# Patient Record
Sex: Female | Born: 1965
Health system: Southern US, Community
[De-identification: ages and names within clinical notes are randomized; demographics above are authoritative.]

## PROBLEM LIST (undated history)

## (undated) DIAGNOSIS — T7840XA Allergy, unspecified, initial encounter: Secondary | ICD-10-CM

## (undated) DIAGNOSIS — F329 Major depressive disorder, single episode, unspecified: Secondary | ICD-10-CM

## (undated) DIAGNOSIS — M7580 Other shoulder lesions, unspecified shoulder: Secondary | ICD-10-CM

## (undated) DIAGNOSIS — J45909 Unspecified asthma, uncomplicated: Secondary | ICD-10-CM

## (undated) DIAGNOSIS — M8580 Other specified disorders of bone density and structure, unspecified site: Secondary | ICD-10-CM

## (undated) DIAGNOSIS — M1990 Unspecified osteoarthritis, unspecified site: Secondary | ICD-10-CM

## (undated) DIAGNOSIS — M722 Plantar fascial fibromatosis: Secondary | ICD-10-CM

## (undated) DIAGNOSIS — B029 Zoster without complications: Secondary | ICD-10-CM

## (undated) DIAGNOSIS — F32A Depression, unspecified: Secondary | ICD-10-CM

## (undated) DIAGNOSIS — R87619 Unspecified abnormal cytological findings in specimens from cervix uteri: Secondary | ICD-10-CM

## (undated) DIAGNOSIS — A64 Unspecified sexually transmitted disease: Secondary | ICD-10-CM

## (undated) HISTORY — PX: COLONOSCOPY: SHX174

## (undated) HISTORY — DX: Allergy, unspecified, initial encounter: T78.40XA

## (undated) HISTORY — PX: COLPOSCOPY: SHX161

## (undated) HISTORY — DX: Zoster without complications: B02.9

## (undated) HISTORY — DX: Major depressive disorder, single episode, unspecified: F32.9

## (undated) HISTORY — DX: Unspecified asthma, uncomplicated: J45.909

## (undated) HISTORY — DX: Unspecified abnormal cytological findings in specimens from cervix uteri: R87.619

## (undated) HISTORY — DX: Unspecified osteoarthritis, unspecified site: M19.90

## (undated) HISTORY — PX: INTRAUTERINE DEVICE INSERTION: SHX323

## (undated) HISTORY — DX: Depression, unspecified: F32.A

## (undated) HISTORY — DX: Plantar fascial fibromatosis: M72.2

## (undated) HISTORY — DX: Other specified disorders of bone density and structure, unspecified site: M85.80

## (undated) HISTORY — DX: Unspecified sexually transmitted disease: A64

## (undated) HISTORY — DX: Other shoulder lesions, unspecified shoulder: M75.80

## (undated) HISTORY — PX: WISDOM TOOTH EXTRACTION: SHX21

---
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
## 1997-09-19 ENCOUNTER — Other Ambulatory Visit: Admission: RE | Admit: 1997-09-19 | Discharge: 1997-09-19 | Payer: Self-pay | Admitting: *Deleted

## 2000-01-01 ENCOUNTER — Other Ambulatory Visit: Admission: RE | Admit: 2000-01-01 | Discharge: 2000-01-01 | Payer: Self-pay | Admitting: Internal Medicine

## 2001-05-08 ENCOUNTER — Other Ambulatory Visit: Admission: RE | Admit: 2001-05-08 | Discharge: 2001-05-08 | Payer: Self-pay | Admitting: Internal Medicine

## 2001-05-12 ENCOUNTER — Encounter: Admission: RE | Admit: 2001-05-12 | Discharge: 2001-05-12 | Payer: Self-pay | Admitting: *Deleted

## 2002-10-31 ENCOUNTER — Other Ambulatory Visit: Admission: RE | Admit: 2002-10-31 | Discharge: 2002-10-31 | Payer: Self-pay | Admitting: Obstetrics and Gynecology

## 2004-01-15 ENCOUNTER — Other Ambulatory Visit: Admission: RE | Admit: 2004-01-15 | Discharge: 2004-01-15 | Payer: Self-pay | Admitting: Obstetrics and Gynecology

## 2005-04-08 ENCOUNTER — Encounter: Admission: RE | Admit: 2005-04-08 | Discharge: 2005-04-08 | Payer: Self-pay | Admitting: Obstetrics and Gynecology

## 2005-04-19 ENCOUNTER — Other Ambulatory Visit: Admission: RE | Admit: 2005-04-19 | Discharge: 2005-04-19 | Payer: Self-pay | Admitting: Obstetrics and Gynecology

## 2005-04-26 ENCOUNTER — Encounter: Admission: RE | Admit: 2005-04-26 | Discharge: 2005-04-26 | Payer: Self-pay | Admitting: Obstetrics and Gynecology

## 2007-10-27 ENCOUNTER — Encounter: Payer: Self-pay | Admitting: Gastroenterology

## 2007-11-27 ENCOUNTER — Other Ambulatory Visit: Admission: RE | Admit: 2007-11-27 | Discharge: 2007-11-27 | Payer: Self-pay | Admitting: Obstetrics and Gynecology

## 2007-11-28 DIAGNOSIS — K921 Melena: Secondary | ICD-10-CM | POA: Insufficient documentation

## 2007-11-29 ENCOUNTER — Ambulatory Visit: Payer: Self-pay | Admitting: Gastroenterology

## 2007-11-29 DIAGNOSIS — R195 Other fecal abnormalities: Secondary | ICD-10-CM | POA: Insufficient documentation

## 2007-11-29 DIAGNOSIS — K625 Hemorrhage of anus and rectum: Secondary | ICD-10-CM | POA: Insufficient documentation

## 2007-12-27 ENCOUNTER — Encounter: Payer: Self-pay | Admitting: Gastroenterology

## 2007-12-27 ENCOUNTER — Ambulatory Visit: Payer: Self-pay | Admitting: Gastroenterology

## 2007-12-28 ENCOUNTER — Encounter: Payer: Self-pay | Admitting: Gastroenterology

## 2010-03-17 NOTE — Procedures (Signed)
Summary: Colonoscopy   Colonoscopy  Procedure date:  12/27/2007  Findings:      Location:  [facility].    Procedures Next Due Date:    Colonoscopy: 12/2012  Patient Name: Jaime Rogers Age MRN:  Procedure Procedures: Colonoscopy CPT: 16109.    with polypectomy. CPT: A3573898.  Personnel: Endoscopist: Venita Lick. Russella Dar, MD, Clementeen Graham.  Referred By: Serafina Royals Felipa Eth, MD.  Exam Location: Exam performed in Outpatient Clinic. Outpatient  Patient Consent: Procedure, Alternatives, Risks and Benefits discussed, consent obtained, from patient. Consent was obtained by the RN.  Indications  Evaluation of: Positive fecal occult blood test  Symptoms: Hematochezia.  Increased Risk Screening: For family history of colorectal neoplasia, in  grandparent age at onset: 11. Family History of Polyps.  Comments: PGF with colon cancer. Father with multiple colon polyps in his 41s History  Current Medications: Patient is not currently taking Coumadin.  Pre-Exam Physical: Performed Dec 27, 2007. Cardio-pulmonary exam, Rectal exam, HEENT exam , Abdominal exam, Mental status exam WNL.  Comments: Pt. history reviewed/updated, physical exam performed prior to initiation of sedation?Yes Exam Exam: Extent of exam reached: Cecum, extent intended: Cecum.  The cecum was identified by appendiceal orifice and IC valve. Time to Cecum: 00:01: 20. Time for Withdrawl: 00:10:24. Colon retroflexion performed. Images taken. ASA Classification: I. Tolerance: excellent.  Monitoring: Pulse and BP monitoring, Oximetry used. Supplemental O2 given.  Colon Prep Used MoviPrep for colon prep. Prep results: excellent.  Sedation Meds: Patient assessed and found to be appropriate for moderate (conscious) sedation. Fentanyl 100 mcg. given IV. Versed 10 mg. given IV.  Findings POLYP: Transverse Colon, Maximum size: 4 mm. sessile polyp. Procedure:  snare without cautery, removed, retrieved, Polyp sent to  pathology. ICD9: Colon Polyps: 211.3.  NORMAL EXAM: Splenic Flexure to Descending Colon.  NORMAL EXAM: Cecum to Hepatic Flexure.  DIVERTICULOSIS: Sigmoid Colon. Not bleeding. ICD9: Diverticulosis: 562.10. Comments: very mild.  NORMAL EXAM: Rectum.   Assessment  Diagnoses: 211.3: Colon Polyps.  562.10: Diverticulosis.   Events  Unplanned Interventions: No intervention was required.  Unplanned Events: There were no complications. Plans  Post Exam Instructions: Post sedation instructions given.  Medication Plan: Await pathology.  Patient Education: Patient given standard instructions for: Polyps. Diverticulosis.  Disposition: After procedure patient sent to recovery. After recovery patient sent home.  Scheduling/Referral: Colonoscopy, to [facility] T. Russella Dar, MD, [facility], around Dec 26, 2012.    cc: Thedora Hinders, MD  REPORT OF SURGICAL PATHOLOGY   Case #: [id number] Patient Name: Jaime Rogers. Office Chart Number:  [id number]   MRN: [id number] Pathologist: Ferd Hibbs. Colonel Bald, MD DOB/Age  [birth year] (Age: 89)    Gender: F Date Taken:  12/27/2007 Date Received: 12/27/2007   FINAL DIAGNOSIS   ***MICROSCOPIC EXAMINATION AND DIAGNOSIS***   COLON, TRANSVERSE, POLYP:   -  LIPOMA. -  MALIGNANT FEATURES ARE NOT IDENTIFIED.   jy Date Reported:  12/28/2007     Ferd Hibbs. Colonel Bald, MD *** Electronically Signed Out By [facility] ***  December 28, 2007 MRN: [id number]    Jaime Rogers Age [address] [place], [place]  [address]    Dear Ms. WOLTZ,  I am pleased to inform you that the biopsies taken during your recent colonoscopy did not show any evidence of cancer upon pathologic examination. The polypoid lesions was a lipoma, a harmless and benign fatty tissue growth. This lesion is not precancerous.  Continue with the treatment plan as outlined on the day of your exam.  You should have a repeat colonoscopy  examination for your family history in 5 years.  Please call us if you  are having persistent problems or have questions about your condition that have not been fully answered at this time.  Sincerely,  Meryl Dare MD [facility]  This letter has been electronically signed by your physician.  This report was created from the original endoscopy report, which was reviewed and signed by the above listed endoscopist.

## 2010-03-17 NOTE — Letter (Signed)
Summary: Patient Notice- Colon Biospy Results  [facility]  [address] [place], [place] [address]   Phone: [phone]  Fax: [phone]        December 28, 2007 MRN: [id number]    Jaime Rogers [address] [place], [place]  [address]    Dear Jaime Rogers,  I am pleased to inform you that the biopsies taken during your recent colonoscopy did not show any evidence of cancer upon pathologic examination. The polypoid lesions was a lipoma, a harmless and benign fatty tissue growth. This lesion is not precancerous.  Continue with the treatment plan as outlined on the day of your      exam.  You should have a repeat colonoscopy examination for your family history in 5 years.  Please call us if you are having persistent problems or have questions about your condition that have not been fully answered at this time.  Sincerely,  Meryl Dare MD [facility]  This letter has been electronically signed by your physician.

## 2010-09-02 ENCOUNTER — Other Ambulatory Visit: Payer: Self-pay | Admitting: Obstetrics and Gynecology

## 2010-09-02 DIAGNOSIS — Z1231 Encounter for screening mammogram for malignant neoplasm of breast: Secondary | ICD-10-CM

## 2010-09-10 ENCOUNTER — Ambulatory Visit
Admission: RE | Admit: 2010-09-10 | Discharge: 2010-09-10 | Disposition: A | Discharge status: Home / Self Care | Payer: BC Managed Care – PPO | Source: Ambulatory Visit | Attending: Obstetrics and Gynecology | Admitting: Obstetrics and Gynecology

## 2010-09-10 DIAGNOSIS — Z1231 Encounter for screening mammogram for malignant neoplasm of breast: Secondary | ICD-10-CM

## 2012-08-28 ENCOUNTER — Encounter: Payer: Self-pay | Admitting: Obstetrics and Gynecology

## 2012-08-28 ENCOUNTER — Ambulatory Visit (INDEPENDENT_AMBULATORY_CARE_PROVIDER_SITE_OTHER): Payer: BC Managed Care – PPO | Admitting: Obstetrics and Gynecology

## 2012-08-28 VITALS — BP 100/64 | HR 82 | Resp 18 | Ht 64.0 in | Wt 150.0 lb

## 2012-08-28 DIAGNOSIS — N390 Urinary tract infection, site not specified: Secondary | ICD-10-CM

## 2012-08-28 DIAGNOSIS — Z202 Contact with and (suspected) exposure to infections with a predominantly sexual mode of transmission: Secondary | ICD-10-CM

## 2012-08-28 DIAGNOSIS — N760 Acute vaginitis: Secondary | ICD-10-CM

## 2012-08-28 LAB — POCT URINALYSIS DIPSTICK
Bilirubin, UA: NEGATIVE
Blood, UA: NEGATIVE
Glucose, UA: NEGATIVE
Ketones, UA: NEGATIVE
Leukocytes, UA: NEGATIVE
Nitrite, UA: NEGATIVE
Protein, UA: NEGATIVE
Urobilinogen, UA: NEGATIVE
pH, UA: 6

## 2012-08-28 MED ORDER — METRONIDAZOLE 0.75 % VA GEL: 1.0000 | Freq: Two times a day (BID) | VAGINAL | Status: DC

## 2012-08-28 MED ORDER — FLUCONAZOLE 150 MG PO TABS: 150.0000 mg | ORAL_TABLET | Freq: Once | ORAL | Status: DC

## 2012-08-28 NOTE — Progress Notes (Signed)
47 yo separated WF G1P0 c/o vulvavag itching and burning and burning with urination because when she wipes, her vulva is tender.  She is currently finished a course of antibiotics for a URI.   Also wants STD testing, and has already collected a clean catch urine. She has ahd a new partner, they last time they were sexually active was 1 month ago.   Pt has a Mirena IUD, removal due Oct 2014.    Exam:  Ext sl erythematous.  Vag - white d/c.  Cx:  IUD strings viz, no purulence.  BM:  Uterus mobile and NT, adnexa neg.  WP:  Pos for yeast and BV.  Ph = 5.5  A:  Yeast vaginitis and BV  P:  Diflucan 150 mg po once now.  Metrogel pv bid for 5 days.  Sample taken from cx for GC, CHL.  Also check HIV, HepB, and RPR.  Condom use q time.  ;

## 2012-08-29 LAB — HEPATITIS B SURFACE ANTIGEN: Hepatitis B Surface Ag: NEGATIVE

## 2012-08-29 LAB — RPR

## 2012-08-29 LAB — HIV ANTIBODY (ROUTINE TESTING W REFLEX): HIV: NONREACTIVE

## 2012-08-30 LAB — IPS N GONORRHOEA AND CHLAMYDIA BY PCR

## 2012-10-24 ENCOUNTER — Encounter: Payer: Self-pay | Admitting: Gastroenterology

## 2012-10-31 ENCOUNTER — Telehealth: Payer: Self-pay | Admitting: Certified Nurse Midwife

## 2012-10-31 NOTE — Telephone Encounter (Signed)
Patient returning Sally's call. °

## 2012-10-31 NOTE — Telephone Encounter (Signed)
Patient wants to set up an appointment to have her IUD removed and replaced. Her old one expires in Oct.  She has an up coming appointment for her annual on Nov 28, 2012 at 8:30 am.

## 2012-10-31 NOTE — Telephone Encounter (Signed)
Last AEX 11-25-11 with Debbi, scheduled for 11-28-12. Mirena was placed 11-27-07 Call to patient, LMTCB.

## 2012-11-01 NOTE — Telephone Encounter (Signed)
Spoke with patient. Appointments booked for aex and iud recheck.

## 2012-11-28 ENCOUNTER — Encounter: Payer: Self-pay | Admitting: Certified Nurse Midwife

## 2012-11-28 ENCOUNTER — Other Ambulatory Visit: Payer: Self-pay | Admitting: Certified Nurse Midwife

## 2012-11-28 ENCOUNTER — Ambulatory Visit: Payer: Self-pay | Admitting: Certified Nurse Midwife

## 2012-11-28 ENCOUNTER — Ambulatory Visit (INDEPENDENT_AMBULATORY_CARE_PROVIDER_SITE_OTHER): Payer: BC Managed Care – PPO | Admitting: Certified Nurse Midwife

## 2012-11-28 VITALS — BP 104/64 | HR 60 | Resp 16 | Ht 64.0 in | Wt 156.0 lb

## 2012-11-28 DIAGNOSIS — Z30430 Encounter for insertion of intrauterine contraceptive device: Secondary | ICD-10-CM

## 2012-11-28 DIAGNOSIS — Z0000 Encounter for general adult medical examination without abnormal findings: Secondary | ICD-10-CM

## 2012-11-28 DIAGNOSIS — Z01419 Encounter for gynecological examination (general) (routine) without abnormal findings: Secondary | ICD-10-CM

## 2012-11-28 DIAGNOSIS — N760 Acute vaginitis: Secondary | ICD-10-CM

## 2012-11-28 MED ORDER — METRONIDAZOLE 0.75 % VA GEL: 1.0000 | Freq: Every day | VAGINAL | Status: DC

## 2012-11-28 NOTE — Progress Notes (Signed)
47 y.o. G85P0010 Married Caucasian Fe here for annual exam.  Periods scant with IUD. Divorce almost final! Sexually active with partner change. Had STD screening in 7/14 which was negative, but has a different partner now. Patient originally here for IUD removal and replacement, but due to partner change STD screening to be done and procedure to be rescheduled. Sees PCP for aex and labs and medication management. No health issues today.  Patient's last menstrual period was 10/17/2011.          Sexually active: yes  The current method of family planning is IUD.    Exercising: yes  walk & cardio Smoker:  no  Health Maintenance: Pap:  11-25-11 neg HPV HR neg MMG: 7/12 neg Colonoscopy: 2009 BMD:   none TDaP: 2011 Labs: none Self breast exam: done occ   reports that she quit smoking about 7 years ago. She has never used smokeless tobacco. She reports that she drinks about 1.5 ounces of alcohol per week. She reports that she does not use illicit drugs.  Past Medical History  Diagnosis Date  . Depression   . STD (sexually transmitted disease)     chlamydia in college    Past Surgical History  Procedure Laterality Date  . Intrauterine device insertion  10/04, 10/09    old mirena out/ insertion of new Mirena IUD  . Appendectomy  1979    Current Outpatient Prescriptions  Medication Sig Dispense Refill  . ALPRAZolam (XANAX PO) Take 0.25 mg by mouth as needed.       Marland Kitchen levonorgestrel (MIRENA) 20 MCG/24HR IUD 1 each by Intrauterine route once.      . Loratadine (ALAVERT PO) Take by mouth as needed.      . Naproxen Sodium (ALEVE PO) Take by mouth as needed.       No current facility-administered medications for this visit.    Family History  Problem Relation Age of Onset  . Endometriosis Mother   . Hypertension Father   . Diverticulitis Father   . Leukemia Maternal Grandmother   . Cancer Paternal Grandfather     colon cancer    ROS:  Pertinent items are noted in HPI.  Otherwise,  a comprehensive ROS was negative.  Exam:   BP 104/64  Pulse 60  Resp 16  Ht 5\' 4"  (1.626 m)  Wt 156 lb (70.761 kg)  BMI 26.76 kg/m2  LMP 10/17/2011 Height: 5\' 4"  (162.6 cm)  Ht Readings from Last 3 Encounters:  11/28/12 5\' 4"  (1.626 m)  08/28/12 5\' 4"  (1.626 m)  11/29/07 5\' 4"  (1.626 m)    General appearance: alert, cooperative and appears stated age Head: Normocephalic, without obvious abnormality, atraumatic Neck: no adenopathy, supple, symmetrical, trachea midline and thyroid normal to inspection and palpation and non-palpable Lungs: clear to auscultation bilaterally Breasts: normal appearance, no masses or tenderness, No nipple retraction or dimpling, No nipple discharge or bleeding, No axillary or supraclavicular adenopathy Heart: regular rate and rhythm Abdomen: soft, non-tender; no masses,  no organomegaly Extremities: extremities normal, atraumatic, no cyanosis or edema Skin: Skin color, texture, turgor normal. No rashes or lesions Lymph nodes: Cervical, supraclavicular, and axillary nodes normal. No abnormal inguinal nodes palpated Neurologic: Grossly normal   Pelvic: External genitalia:  no lesions              Urethra:  normal appearing urethra with no masses, tenderness or lesions              Bartholin's and Skene's: normal  Vagina: normal appearing vagina with normal color and grey watery discharge noted, no lesions Ph 5.0  Wet prep taken              Cervix: normal appeance with IUD string noted              Pap taken: yes Bimanual Exam:  Uterus:  normal size, contour, position, consistency, mobility, non-tender and mid position              Adnexa: normal adnexa and no mass, fullness, tenderness               Rectovaginal: Confirms               Anus:  normal sphincter tone, no lesions  Wet prep: Positive for BV, negative yeast and trich  A:  Well Woman with normal exam  Contraception Mirena IUD due for replacement, canceled today due to  screening needs to be done  BV  STD screening  P:   Reviewed health and wellness pertinent to exam  Will schedule Mirena IUD when labs in, no sexual activity until IUD  Reviewed findings. Rx Metrogel see order  Lab: Gc Chlamydia  Pap smear as per guidelines   Mammogram yearly pap smear taken today with reflex only  counseled on breast self exam, mammography screening, STD prevention, HIV risk factors and prevention, adequate intake of calcium and vitamin D, diet and exercise  return annually or prn   An After Visit Summary was printed and given to the patient.

## 2012-11-28 NOTE — Patient Instructions (Signed)
General topics  Next pap or exam is  due in 1 year Take a Women's multivitamin Take 1200 mg. of calcium daily - prefer dietary If any concerns in interim to call back  Breast Self-Awareness Practicing breast self-awareness may pick up problems early, prevent significant medical complications, and possibly save your life. By practicing breast self-awareness, you can become familiar with how your breasts look and feel and if your breasts are changing. This allows you to notice changes early. It can also offer you some reassurance that your breast health is good. One way to learn what is normal for your breasts and whether your breasts are changing is to do a breast self-exam. If you find a lump or something that was not present in the past, it is best to contact your caregiver right away. Other findings that should be evaluated by your caregiver include nipple discharge, especially if it is bloody; skin changes or reddening; areas where the skin seems to be pulled in (retracted); or new lumps and bumps. Breast pain is seldom associated with cancer (malignancy), but should also be evaluated by a caregiver. BREAST SELF-EXAM The best time to examine your breasts is 5 7 days after your menstrual period is over.  ExitCare Patient Information 2013 [employer].   Exercise to Stay Healthy Exercise helps you become and stay healthy. EXERCISE IDEAS AND TIPS Choose exercises that:  You enjoy.  Fit into your day. You do not need to exercise really hard to be healthy. You can do exercises at a slow or medium level and stay healthy. You can:  Stretch before and after working out.  Try yoga, Pilates, or tai chi.  Lift weights.  Walk fast, swim, jog, run, climb stairs, bicycle, dance, or rollerskate.  Take aerobic classes. Exercises that burn about 150 calories:  Running 1  miles in 15 minutes.  Playing volleyball for 45 to 60 minutes.  Washing and waxing a car for 45 to 60  minutes.  Playing touch football for 45 minutes.  Walking 1  miles in 35 minutes.  Pushing a stroller 1  miles in 30 minutes.  Playing basketball for 30 minutes.  Raking leaves for 30 minutes.  Bicycling 5 miles in 30 minutes.  Walking 2 miles in 30 minutes.  Dancing for 30 minutes.  Shoveling snow for 15 minutes.  Swimming laps for 20 minutes.  Walking up stairs for 15 minutes.  Bicycling 4 miles in 15 minutes.  Gardening for 30 to 45 minutes.  Jumping rope for 15 minutes.  Washing windows or floors for 45 to 60 minutes. Document Released: 03/06/2010 Document Revised: 04/26/2011 Document Reviewed: 03/06/2010 ExitCare Patient Information 2013 [employer].   Other topics ( that may be useful information):    Sexually Transmitted Disease Sexually transmitted disease (STD) refers to any infection that is passed from person to person during sexual activity. This may happen by way of saliva, semen, blood, vaginal mucus, or urine. Common STDs include:  Gonorrhea.  Chlamydia.  Syphilis.  HIV/AIDS.  Genital herpes.  Hepatitis B and C.  Trichomonas.  Human papillomavirus (HPV).  Pubic lice. CAUSES  An STD may be spread by bacteria, virus, or parasite. A person can get an STD by:  Sexual intercourse with an infected person.  Sharing sex toys with an infected person.  Sharing needles with an infected person.  Having intimate contact with the genitals, mouth, or rectal areas of an infected person. SYMPTOMS  Some people may not have any symptoms, but   they can still pass the infection to others. Different STDs have different symptoms. Symptoms include:  Painful or bloody urination.  Pain in the pelvis, abdomen, vagina, anus, throat, or eyes.  Skin rash, itching, irritation, growths, or sores (lesions). These usually occur in the genital or anal area.  Abnormal vaginal discharge.  Penile discharge in men.  Soft, flesh-colored skin growths in the  genital or anal area.  Fever.  Pain or bleeding during sexual intercourse.  Swollen glands in the groin area.  Yellow skin and eyes (jaundice). This is seen with hepatitis. DIAGNOSIS  To make a diagnosis, your caregiver may:  Take a medical history.  Perform a physical exam.  Take a specimen (culture) to be examined.  Examine a sample of discharge under a microscope.  Perform blood test TREATMENT   Chlamydia, gonorrhea, trichomonas, and syphilis can be cured with antibiotic medicine.  Genital herpes, hepatitis, and HIV can be treated, but not cured, with prescribed medicines. The medicines will lessen the symptoms.  Genital warts from HPV can be treated with medicine or by freezing, burning (electrocautery), or surgery. Warts may come back.  HPV is a virus and cannot be cured with medicine or surgery.However, abnormal areas may be followed very closely by your caregiver and may be removed from the cervix, vagina, or vulva through office procedures or surgery. If your diagnosis is confirmed, your recent sexual partners need treatment. This is true even if they are symptom-free or have a negative culture or evaluation. They should not have sex until their caregiver says it is okay. HOME CARE INSTRUCTIONS  All sexual partners should be informed, tested, and treated for all STDs.  Take your antibiotics as directed. Finish them even if you start to feel better.  Only take over-the-counter or prescription medicines for pain, discomfort, or fever as directed by your caregiver.  Rest.  Eat a balanced diet and drink enough fluids to keep your urine clear or pale yellow.  Do not have sex until treatment is completed and you have followed up with your caregiver. STDs should be checked after treatment.  Keep all follow-up appointments, Pap tests, and blood tests as directed by your caregiver.  Only use latex condoms and water-soluble lubricants during sexual activity. Do not use  petroleum jelly or oils.  Avoid alcohol and illegal drugs.  Get vaccinated for HPV and hepatitis. If you have not received these vaccines in the past, talk to your caregiver about whether one or both might be right for you.  Avoid risky sex practices that can break the skin. The only way to avoid getting an STD is to avoid all sexual activity.Latex condoms and dental dams (for oral sex) will help lessen the risk of getting an STD, but will not completely eliminate the risk. SEEK MEDICAL CARE IF:   You have a fever.  You have any new or worsening symptoms. Document Released: 04/24/2002 Document Revised: 04/26/2011 Document Reviewed: 05/01/2010 ExitCare Patient Information 2013 [employer].    Domestic Abuse You are being battered or abused if someone close to you hits, pushes, or physically hurts you in any way. You also are being abused if you are forced into activities. You are being sexually abused if you are forced to have sexual contact of any kind. You are being emotionally abused if you are made to feel worthless or if you are constantly threatened. It is important to remember that help is available. No one has the right to abuse you. PREVENTION OF FURTHER   ABUSE  Learn the warning signs of danger. This varies with situations but may include: the use of alcohol, threats, isolation from friends and family, or forced sexual contact. Leave if you feel that violence is going to occur.  If you are attacked or beaten, report it to the police so the abuse is documented. You do not have to press charges. The police can protect you while you or the attackers are leaving. Get the officer's name and badge number and a copy of the report.  Find someone you can trust and tell them what is happening to you: your caregiver, a nurse, clergy member, close friend or family member. Feeling ashamed is natural, but remember that you have done nothing wrong. No one deserves abuse. Document Released:  01/30/2000 Document Revised: 04/26/2011 Document Reviewed: 04/09/2010 ExitCare Patient Information 2013 [employer].    How Much is Too Much Alcohol? Drinking too much alcohol can cause injury, accidents, and health problems. These types of problems can include:   Car crashes.  Falls.  Family fighting (domestic violence).  Drowning.  Fights.  Injuries.  Burns.  Damage to certain organs.  Having a baby with birth defects. ONE DRINK CAN BE TOO MUCH WHEN YOU ARE:  Working.  Pregnant or breastfeeding.  Taking medicines. Ask your doctor.  Driving or planning to drive. If you or someone you know has a drinking problem, get help from a doctor.  Document Released: 11/28/2008 Document Revised: 04/26/2011 Document Reviewed: 11/28/2008 ExitCare Patient Information 2013 [employer].   Smoking Hazards Smoking cigarettes is extremely bad for your health. Tobacco smoke has over 200 known poisons in it. There are over 60 chemicals in tobacco smoke that cause cancer. Some of the chemicals found in cigarette smoke include:   Cyanide.  Benzene.  Formaldehyde.  Methanol (wood alcohol).  Acetylene (fuel used in welding torches).  Ammonia. Cigarette smoke also contains the poisonous gases nitrogen oxide and carbon monoxide.  Cigarette smokers have an increased risk of many serious medical problems and Smoking causes approximately:  90% of all lung cancer deaths in men.  80% of all lung cancer deaths in women.  90% of deaths from chronic obstructive lung disease. Compared with nonsmokers, smoking increases the risk of:  Coronary heart disease by 2 to 4 times.  Stroke by 2 to 4 times.  Men developing lung cancer by 23 times.  Women developing lung cancer by 13 times.  Dying from chronic obstructive lung diseases by 12 times.  . Smoking is the most preventable cause of death and disease in our society.  WHY IS SMOKING ADDICTIVE?  Nicotine is the chemical  agent in tobacco that is capable of causing addiction or dependence.  When you smoke and inhale, nicotine is absorbed rapidly into the bloodstream through your lungs. Nicotine absorbed through the lungs is capable of creating a powerful addiction. Both inhaled and non-inhaled nicotine may be addictive.  Addiction studies of cigarettes and spit tobacco show that addiction to nicotine occurs mainly during the teen years, when young people begin using tobacco products. WHAT ARE THE BENEFITS OF QUITTING?  There are many health benefits to quitting smoking.   Likelihood of developing cancer and heart disease decreases. Health improvements are seen almost immediately.  Blood pressure, pulse rate, and breathing patterns start returning to normal soon after quitting. QUITTING SMOKING   American Lung Association - 1-800-LUNGUSA  American Cancer Society - 1-800-ACS-2345 Document Released: 03/11/2004 Document Revised: 04/26/2011 Document Reviewed: 11/13/2008 ExitCare Patient Information 2013 [employer].   Stress Management Stress is a state of physical or mental tension that often results from changes in your life or normal routine. Some common causes of stress are:  Death of a loved one.  Injuries or severe illnesses.  Getting fired or changing jobs.  Moving into a new home. Other causes may be:  Sexual problems.  Business or financial losses.  Taking on a large debt.  Regular conflict with someone at home or at work.  Constant tiredness from lack of sleep. It is not just bad things that are stressful. It may be stressful to:  Win the lottery.  Get married.  Buy a new car. The amount of stress that can be easily tolerated varies from person to person. Changes generally cause stress, regardless of the types of change. Too much stress can affect your health. It may lead to physical or emotional problems. Too little stress (boredom) may also become stressful. SUGGESTIONS TO  REDUCE STRESS:  Talk things over with your family and friends. It often is helpful to share your concerns and worries. If you feel your problem is serious, you may want to get help from a professional counselor.  Consider your problems one at a time instead of lumping them all together. Trying to take care of everything at once may seem impossible. List all the things you need to do and then start with the most important one. Set a goal to accomplish 2 or 3 things each day. If you expect to do too many in a single day you will naturally fail, causing you to feel even more stressed.  Do not use alcohol or drugs to relieve stress. Although you may feel better for a short time, they do not remove the problems that caused the stress. They can also be habit forming.  Exercise regularly - at least 3 times per week. Physical exercise can help to relieve that "uptight" feeling and will relax you.  The shortest distance between despair and hope is often a good night's sleep.  Go to bed and get up on time allowing yourself time for appointments without being rushed.  Take a short "time-out" period from any stressful situation that occurs during the day. Close your eyes and take some deep breaths. Starting with the muscles in your face, tense them, hold it for a few seconds, then relax. Repeat this with the muscles in your neck, shoulders, hand, stomach, back and legs.  Take good care of yourself. Eat a balanced diet and get plenty of rest.  Schedule time for having fun. Take a break from your daily routine to relax. HOME CARE INSTRUCTIONS   Call if you feel overwhelmed by your problems and feel you can no longer manage them on your own.  Return immediately if you feel like hurting yourself or someone else. Document Released: 07/28/2000 Document Revised: 04/26/2011 Document Reviewed: 03/20/2007 ExitCare Patient Information 2013 [employer].   

## 2012-11-29 LAB — IPS N GONORRHOEA AND CHLAMYDIA BY PCR

## 2012-11-29 LAB — IPS PAP TEST WITH REFLEX TO HPV

## 2012-11-30 ENCOUNTER — Telehealth: Payer: Self-pay | Admitting: Emergency Medicine

## 2012-11-30 NOTE — Telephone Encounter (Signed)
Message copied by Joeseph Amor on Thu Nov 30, 2012  8:42 AM ------      Message from: Verner Chol      Created: Thu Nov 30, 2012  8:31 AM       Notify GC, Chlamydia negative      Pap smear negative 02      Can reschedule IUD insertion in the next two weeks with me needs procedure slot ------

## 2012-11-30 NOTE — Progress Notes (Signed)
Notes Recorded by Almedia Balls, RN on 11/30/2012 at 8:44 AM Telephone encounter created. Message left to return call to [place] at [phone].

## 2012-11-30 NOTE — Telephone Encounter (Signed)
Message left to return call to Jaime Rogers at [phone].    

## 2012-11-30 NOTE — Progress Notes (Signed)
Note reviewed, agree with plan.  Lynniah Janoski, MD  

## 2012-11-30 NOTE — Telephone Encounter (Signed)
Jaime Rogers:  Patient scheduled for IUD insertion.  Not sure if you need to precert.   Spoke with patient and message from Verner Chol CNM given. Verbalized understanding and appt for IUD given.

## 2012-12-13 ENCOUNTER — Ambulatory Visit (INDEPENDENT_AMBULATORY_CARE_PROVIDER_SITE_OTHER): Payer: BC Managed Care – PPO | Admitting: Certified Nurse Midwife

## 2012-12-13 VITALS — BP 104/64 | HR 64 | Resp 16 | Ht 64.0 in | Wt 158.0 lb

## 2012-12-13 DIAGNOSIS — Z30433 Encounter for removal and reinsertion of intrauterine contraceptive device: Secondary | ICD-10-CM

## 2012-12-13 NOTE — Progress Notes (Signed)
70 yrsDivorced Caucasian female go po presents for Mirena removal and insertion of new Mirena IUD. Denies any vaginal symptoms or STD concerns. LMPSeptember, 2014 spotting only.  Information read regarding Mirena  IUD, consent signed. Questions addressed.  Request same as above.  Healthy female,time, place and person scant menses, no abnormal bleeding, pelvic pain or discharge Abdomen: soft, non-tender Groinno inguinal nodes palpated and no enlargement  Pelvic exam: Vulva;normal female genitalia, Bartholin's, Urethra, Skene normal, no lesions  Vagina:normal vagina, no discharge, exudate, lesion, or erythema  Cervix:Non-tender, Negative CMT, no lesions or redness, nulliparous/parous os  Uterus:normal shape, position and consistency, mid position   Lab:no pathogens  Procedure:  Speculum inserted into vagina. Cervix visualized. Mirena IUD string visualized and grasped with ring forceps.  With gentle traction IUD removed intact, without difficulty, and shown to patient. Cervix cleansed with betadine solution X 3. Tenaculum placed on cervix at 10 and 2 o'clock position(s).  Uterus sounded to 7 1/2 centimeters.  Mirena, Lot # [id number], Expiration date 8/16. IUD removed from sterile packet and under sterile conditions inserted with out difficulty.  IUD string trimmed to 3 centimeters.  Remainder string given to patient to feel for identification.  Tenaculum removed. scant bleeding noted.  Speculum removed.  Uterus palpated normal.  Patient tolerated procedure well.    A: Mirena IUD removal and insertion of new Mirena, Lot #[id number], Expiration date 8/16   P: Instructions and warnings signs given.     IUD identification card given with IUD removalOctober 29,2019     Return visit one month, prn

## 2012-12-14 NOTE — Progress Notes (Signed)
Note reviewed, agree with plan.  Akashdeep Chuba, MD  

## 2012-12-21 ENCOUNTER — Other Ambulatory Visit: Payer: Self-pay

## 2012-12-26 ENCOUNTER — Other Ambulatory Visit: Payer: Self-pay

## 2012-12-26 DIAGNOSIS — Z1231 Encounter for screening mammogram for malignant neoplasm of breast: Secondary | ICD-10-CM

## 2013-01-17 ENCOUNTER — Ambulatory Visit: Payer: Self-pay | Admitting: Certified Nurse Midwife

## 2013-01-17 ENCOUNTER — Encounter: Payer: Self-pay | Admitting: Certified Nurse Midwife

## 2013-01-17 ENCOUNTER — Ambulatory Visit (INDEPENDENT_AMBULATORY_CARE_PROVIDER_SITE_OTHER): Payer: BC Managed Care – PPO | Admitting: Certified Nurse Midwife

## 2013-01-17 VITALS — BP 102/64 | HR 68 | Resp 16 | Ht 64.0 in | Wt 159.0 lb

## 2013-01-17 DIAGNOSIS — Z30431 Encounter for routine checking of intrauterine contraceptive device: Secondary | ICD-10-CM

## 2013-01-17 NOTE — Progress Notes (Signed)
47 y.o. Divorced [place] female G1P0010 here for follow up of Mirena IUD inserted on 12/13/12. Patient reports no bleeding since insertion. Occasional cramping at first but none now. Denies pelvic pain or change in vaginal discharge. Happy with choice again! Leaving for a "girl trip" in a week to [place] No health issue today.   O: Healthy WD,WN female Affect: normal, orientation x 3  Abdomen:non tender, soft Pelvic exam:EXTERNAL GENITALIA: normal appearing vulva with no masses, tenderness or lesions VAGINA: no abnormal discharge or lesions CERVIX: no lesions or cervical motion tenderness and IUD string visualized in cervix with string length 3 cm UTERUS: normal size, shape, non tender ADNEXA: no masses palpable and nontender  A:Normal Mirena IUD surveillance Normal pelvic exam  P: Discussed findings of normal IUD check and normal pelvic exam. Mirena IUD due removal 12/13/17    Instructions given regarding warning signs of IUD.  RV prn

## 2013-01-19 ENCOUNTER — Ambulatory Visit
Admission: RE | Admit: 2013-01-19 | Discharge: 2013-01-19 | Disposition: A | Discharge status: Home / Self Care | Payer: BC Managed Care – PPO | Source: Ambulatory Visit

## 2013-01-19 DIAGNOSIS — Z1231 Encounter for screening mammogram for malignant neoplasm of breast: Secondary | ICD-10-CM

## 2013-01-19 NOTE — Progress Notes (Signed)
Note reviewed, agree with plan.  Aubreyanna Dorrough, MD  

## 2013-07-14 ENCOUNTER — Encounter: Payer: Self-pay | Admitting: Gastroenterology

## 2013-11-29 ENCOUNTER — Ambulatory Visit (INDEPENDENT_AMBULATORY_CARE_PROVIDER_SITE_OTHER): Payer: BC Managed Care – PPO | Admitting: Certified Nurse Midwife

## 2013-11-29 ENCOUNTER — Encounter: Payer: Self-pay | Admitting: Certified Nurse Midwife

## 2013-11-29 VITALS — BP 110/62 | HR 68 | Resp 16 | Ht 64.25 in | Wt 166.0 lb

## 2013-11-29 DIAGNOSIS — Z01419 Encounter for gynecological examination (general) (routine) without abnormal findings: Secondary | ICD-10-CM

## 2013-11-29 NOTE — Patient Instructions (Signed)

## 2013-11-29 NOTE — Progress Notes (Signed)
48 y.o. G44P0010 Divorced Caucasian Fe here for annual exam. Periods scant with Mirena IUD. Not sexually active.  Back on Lexapro now, working well for anxiety. Happy now divorce is over. Traveled all summer! Sees PCP medication management and labs. No health issues today.    Patient's last menstrual period was 11/12/2013.          Sexually active: No.  The current method of family planning is IUD.    Exercising: Yes.    weights & walking Smoker:  no  Health Maintenance: Pap: 11-28-12 neg MMG: 01-19-13 density category c, birads category 1:neg Colonoscopy: 2009 BMD:   none TDaP:  2011 Labs: none Self breast exam: not done   reports that she quit smoking about 8 years ago. She has never used smokeless tobacco. She reports that she drinks about 4 ounces of alcohol per week. She reports that she does not use illicit drugs.  Past Medical History  Diagnosis Date  . Depression   . STD (sexually transmitted disease)     chlamydia in college    Past Surgical History  Procedure Laterality Date  . Intrauterine device insertion  10/04, 10/09    old mirena out/ insertion of new Mirena IUD, insertion again 10/14  . Appendectomy  1979    Current Outpatient Prescriptions  Medication Sig Dispense Refill  . ALPRAZolam (XANAX PO) Take 0.25 mg by mouth as needed.       Marland Kitchen escitalopram (LEXAPRO) 20 MG tablet Take 20 mg by mouth daily.      . fluticasone (FLONASE) 50 MCG/ACT nasal spray Place into both nostrils daily.      Marland Kitchen levonorgestrel (MIRENA) 20 MCG/24HR IUD 1 each by Intrauterine route once.      . Loratadine (ALAVERT PO) Take by mouth as needed.      . Multiple Vitamins-Minerals (MULTIVITAMIN PO) Take by mouth daily.      . Naproxen Sodium (ALEVE PO) Take by mouth as needed.       No current facility-administered medications for this visit.    Family History  Problem Relation Age of Onset  . Endometriosis Mother   . Hypertension Father   . Diverticulitis Father   . Leukemia  Maternal Grandmother   . Cancer Paternal Grandfather     colon cancer    ROS:  Pertinent items are noted in HPI.  Otherwise, a comprehensive ROS was negative.  Exam:   BP 110/62  Pulse 68  Resp 16  Ht 5' 4.25" (1.632 m)  Wt 166 lb (75.297 kg)  BMI 28.27 kg/m2  LMP 11/12/2013 Height: 5' 4.25" (163.2 cm)  Ht Readings from Last 3 Encounters:  11/29/13 5' 4.25" (1.632 m)  01/17/13 5\' 4"  (1.626 m)  12/13/12 5\' 4"  (1.626 m)    General appearance: alert, cooperative and appears stated age Head: Normocephalic, without obvious abnormality, atraumatic Neck: no adenopathy, supple, symmetrical, trachea midline and thyroid normal to inspection and palpation Lungs: clear to auscultation bilaterally Breasts: normal appearance, no masses or tenderness, No nipple retraction or dimpling, No nipple discharge or bleeding, No axillary or supraclavicular adenopathy Heart: regular rate and rhythm Abdomen: soft, non-tender; no masses,  no organomegaly Extremities: extremities normal, atraumatic, no cyanosis or edema Skin: Skin color, texture, turgor normal. No rashes or lesions Lymph nodes: Cervical, supraclavicular, and axillary nodes normal. No abnormal inguinal nodes palpated Neurologic: Grossly normal   Pelvic: External genitalia:  no lesions              Urethra:  normal appearing urethra with no masses, tenderness or lesions              Bartholin's and Skene's: normal                 Vagina: normal appearing vagina with normal color and discharge, no lesions              Cervix: normal, non tender, no lesions IUD string noted              Pap taken: No. Bimanual Exam:  Uterus:  normal size, contour, position, consistency, mobility, non-tender and anteverted              Adnexa: normal adnexa and no mass, fullness, tenderness               Rectovaginal: Confirms               Anus:  normal sphincter tone, no lesions  A:  Well Woman with normal exam  Mirena IUD due for removal  11/28/17  Anxiety stable medication with PCP management  P:   Reviewed health and wellness pertinent to exam  Aware of warning signs with IUD  Continue follow up as indicated  Pap smear not taken today   counseled on breast self exam, mammography screening, STD prevention, HIV risk factors and prevention, adequate intake of calcium and vitamin D, diet and exercise  return annually or prn  An After Visit Summary was printed and given to the patient.

## 2013-12-07 NOTE — Progress Notes (Signed)
Reviewed personally.  M. Suzanne Rebecka Oelkers, MD.  

## 2013-12-17 ENCOUNTER — Encounter: Payer: Self-pay | Admitting: Certified Nurse Midwife

## 2014-12-05 ENCOUNTER — Encounter: Payer: Self-pay | Admitting: Certified Nurse Midwife

## 2014-12-05 ENCOUNTER — Ambulatory Visit (INDEPENDENT_AMBULATORY_CARE_PROVIDER_SITE_OTHER): Payer: BLUE CROSS/BLUE SHIELD | Admitting: Certified Nurse Midwife

## 2014-12-05 VITALS — BP 100/70 | HR 72 | Resp 16 | Ht 64.25 in | Wt 172.0 lb

## 2014-12-05 DIAGNOSIS — Z01419 Encounter for gynecological examination (general) (routine) without abnormal findings: Secondary | ICD-10-CM | POA: Diagnosis not present

## 2014-12-05 DIAGNOSIS — Z124 Encounter for screening for malignant neoplasm of cervix: Secondary | ICD-10-CM

## 2014-12-05 NOTE — Progress Notes (Signed)
49 y.o. G56P0010 Divorced  Caucasian Fe here for annual exam. Periods scant to none with Mirena IUD. No issues with IUD. Sees Dr. Dagmar Hait yearly and for medication management for anxiety, all labs normal per patient. One partner in past year, does not desire STD screening. Aware she is overdue for colonoscopy, plans to schedule that and mammogram. Occasional hot flash and night sweat, no other issues today. [place] for her 49th birthday!  No LMP recorded. Patient is not currently having periods (Reason: IUD).          Sexually active: No.  The current method of family planning is IUD.    Exercising: Yes.    walk & weights Smoker:  no  Health Maintenance: Pap:  11-28-12 neg MMG: 01-19-13 category c density,birads 1:neg Colonoscopy: 2009 Maryanna Shape  Overdue  Family history of colon cancer BMD:   none TDaP:  2011 Labs: pcp Self breast exam: not done   reports that she quit smoking about 9 years ago. She has never used smokeless tobacco. She reports that she drinks about 4.0 oz of alcohol per week. She reports that she does not use illicit drugs.  Past Medical History  Diagnosis Date  . Depression   . STD (sexually transmitted disease)     chlamydia in college    Past Surgical History  Procedure Laterality Date  . Intrauterine device insertion  10/04, 10/09    old mirena out/ insertion of new Mirena IUD, insertion again 10/14  . Appendectomy  1979    Current Outpatient Prescriptions  Medication Sig Dispense Refill  . ALPRAZolam (XANAX PO) Take 0.25 mg by mouth as needed.     Marland Kitchen escitalopram (LEXAPRO) 20 MG tablet Take 20 mg by mouth daily.    . fluticasone (FLONASE) 50 MCG/ACT nasal spray Place into both nostrils daily.    Marland Kitchen levonorgestrel (MIRENA) 20 MCG/24HR IUD 1 each by Intrauterine route once.    . Loratadine (ALAVERT PO) Take by mouth as needed.    . Multiple Vitamins-Minerals (MULTIVITAMIN PO) Take by mouth daily.    . Naproxen Sodium (ALEVE PO) Take by mouth as needed.      No current facility-administered medications for this visit.    Family History  Problem Relation Age of Onset  . Endometriosis Mother   . Hypertension Father   . Diverticulitis Father   . Leukemia Maternal Grandmother   . Cancer Paternal Grandfather     colon cancer    ROS:  Pertinent items are noted in HPI.  Otherwise, a comprehensive ROS was negative.  Exam:   There were no vitals taken for this visit.   Ht Readings from Last 3 Encounters:  11/29/13 5' 4.25" (1.632 m)  01/17/13 5\' 4"  (1.626 m)  12/13/12 5\' 4"  (1.626 m)    General appearance: alert, cooperative and appears stated age Head: Normocephalic, without obvious abnormality, atraumatic Neck: no adenopathy, supple, symmetrical, trachea midline and thyroid normal to inspection and palpation Lungs: clear to auscultation bilaterally Breasts: normal appearance, no masses or tenderness, No nipple retraction or dimpling, No nipple discharge or bleeding, No axillary or supraclavicular adenopathy Heart: regular rate and rhythm Abdomen: soft, non-tender; no masses,  no organomegaly Extremities: extremities normal, atraumatic, no cyanosis or edema Skin: Skin color, texture, turgor normal. No rashes or lesions Lymph nodes: Cervical, supraclavicular, and axillary nodes normal. No abnormal inguinal nodes palpated Neurologic: Grossly normal   Pelvic: External genitalia:  no lesions  Urethra:  normal appearing urethra with no masses, tenderness or lesions              Bartholin's and Skene's: normal                 Vagina: normal appearing vagina with normal color and discharge, no lesions              Cervix: normal,non tender, no lesions IUD string noted in cervix              Pap taken: Yes.   Bimanual Exam:  Uterus:  normal size, contour, position, consistency, mobility, non-tender and anteverted              Adnexa: normal adnexa and no mass, fullness, tenderness               Rectovaginal: Confirms                Anus:  normal sphincter tone, no lesions  Chaperone present: yes  A:  Well Woman with normal exam  Contraception Mirena IUD removal due 10/19  Perimenopausal  Colonoscopy/Mammogram due  Anxiety with PCP management of medication  P:   Reviewed health and wellness pertinent to exam  Aware of warning signs with IUD and need to advise  Discussed etiology of perimenopause and expectations, handout given  Discussed importance of follow up with family history of colon cancer and mammogram with dense breast . Patient plans to schedule herself soon.  Continue follow up with PCP as indicated.  Pap smear as above with HPVHR   counseled on breast self exam, mammography screening, STD prevention, HIV risk factors and prevention, adequate intake of calcium and vitamin D, diet and exercise  return annually or prn  An After Visit Summary was printed and given to the patient.

## 2014-12-05 NOTE — Patient Instructions (Signed)

## 2014-12-09 LAB — IPS PAP TEST WITH HPV

## 2014-12-11 NOTE — Progress Notes (Signed)
Encounter reviewed Jaime Tuite, MD   

## 2014-12-23 ENCOUNTER — Encounter: Payer: Self-pay | Admitting: Gastroenterology

## 2015-06-06 DIAGNOSIS — L820 Inflamed seborrheic keratosis: Secondary | ICD-10-CM | POA: Diagnosis not present

## 2015-07-18 ENCOUNTER — Encounter: Payer: Self-pay | Admitting: Certified Nurse Midwife

## 2015-07-18 ENCOUNTER — Ambulatory Visit (INDEPENDENT_AMBULATORY_CARE_PROVIDER_SITE_OTHER): Payer: BLUE CROSS/BLUE SHIELD | Admitting: Certified Nurse Midwife

## 2015-07-18 VITALS — BP 110/60 | HR 72 | Resp 16 | Wt 179.0 lb

## 2015-07-18 DIAGNOSIS — L293 Anogenital pruritus, unspecified: Secondary | ICD-10-CM | POA: Diagnosis not present

## 2015-07-18 DIAGNOSIS — B009 Herpesviral infection, unspecified: Secondary | ICD-10-CM | POA: Diagnosis not present

## 2015-07-18 DIAGNOSIS — N898 Other specified noninflammatory disorders of vagina: Secondary | ICD-10-CM

## 2015-07-18 DIAGNOSIS — L298 Other pruritus: Secondary | ICD-10-CM | POA: Diagnosis not present

## 2015-07-18 MED ORDER — VALACYCLOVIR HCL 500 MG PO TABS: ORAL_TABLET | ORAL | Status: DC

## 2015-07-18 NOTE — Patient Instructions (Signed)

## 2015-07-18 NOTE — Progress Notes (Signed)
50 y.o. Divorced Caucasian female G1P0010 here with complaint of vaginal symptoms of slight tingle and soreness on right lip area, and increase in vaginal discharge,no odor . Describes discharge as white. Onset of symptoms 3 days ago. Denies new personal products or vaginal dryness. No STD concerns. Urinary symptoms none . Contraception is Mirena IUD. Patient has felt slight fatigue. Also has noted some rash on left leg, but has had poison ivy recently, feeling better. No other health issues today. Patient history of shingles in the past, no symptoms recently.   O:Healthy female WDWN Affect: normal, orientation x 3  Exam:Skin warm and dry Abdomen: soft, non tender  Inguinal Lymph nodes: no enlargement or tenderness Pelvic exam: External genital: normal female with herpetic type blister on lower right labia, tender, slightly red. Culture taken BUS: negative Vagina: white odorous discharge noted.   Affirm taken Cervix: normal, non tender, no CMT Uterus: normal, non tender Adnexa:normal, non tender, no masses or fullness noted    A:Normal pelvic exam HSV genital ? Previous history of shingles Previous exposure to poison ivy   P:Discussed findings of HSV appearance and etiology. Discussed hard to obtain culture, but appearance is HSV. Discussed Aveeno or baking soda sitz bath for comfort. Discussed transmission and length of outbreak. Discussed Valtrex treatment for HSV due to appearance. Patient would like treatment. Questions addressed. Rx: Valtrex see order with instructions Lab: HSV culture  Rv 2 weeks, prn

## 2015-07-19 LAB — WET PREP BY MOLECULAR PROBE
Candida species: NEGATIVE
Gardnerella vaginalis: POSITIVE — AB
Trichomonas vaginosis: NEGATIVE

## 2015-07-21 ENCOUNTER — Other Ambulatory Visit: Payer: Self-pay | Admitting: Nurse Practitioner

## 2015-07-21 DIAGNOSIS — Z6829 Body mass index (BMI) 29.0-29.9, adult: Secondary | ICD-10-CM | POA: Diagnosis not present

## 2015-07-21 DIAGNOSIS — L237 Allergic contact dermatitis due to plants, except food: Secondary | ICD-10-CM | POA: Diagnosis not present

## 2015-07-21 LAB — HERPES SIMPLEX VIRUS CULTURE: Organism ID, Bacteria: NOT DETECTED

## 2015-07-21 MED ORDER — METRONIDAZOLE 0.75 % VA GEL: 1.0000 | Freq: Every day | VAGINAL | Status: DC

## 2015-07-21 NOTE — Progress Notes (Signed)
Reviewed personally.  M. Suzanne Xitlalic Maslin, MD.  

## 2015-07-23 ENCOUNTER — Other Ambulatory Visit: Payer: Self-pay | Admitting: Internal Medicine

## 2015-07-23 DIAGNOSIS — Z1231 Encounter for screening mammogram for malignant neoplasm of breast: Secondary | ICD-10-CM

## 2015-08-06 ENCOUNTER — Ambulatory Visit
Admission: RE | Admit: 2015-08-06 | Discharge: 2015-08-06 | Disposition: A | Discharge status: Home / Self Care | Payer: BLUE CROSS/BLUE SHIELD | Source: Ambulatory Visit | Attending: Internal Medicine | Admitting: Internal Medicine

## 2015-08-06 DIAGNOSIS — Z1231 Encounter for screening mammogram for malignant neoplasm of breast: Secondary | ICD-10-CM | POA: Diagnosis not present

## 2015-09-01 ENCOUNTER — Telehealth: Payer: Self-pay | Admitting: Certified Nurse Midwife

## 2015-09-01 DIAGNOSIS — Z1159 Encounter for screening for other viral diseases: Secondary | ICD-10-CM

## 2015-09-01 NOTE — Telephone Encounter (Signed)
The lab order is entered.

## 2015-09-01 NOTE — Telephone Encounter (Signed)
Patient would like to discuss having bloodwork done for herpes and not wait til October.

## 2015-09-01 NOTE — Telephone Encounter (Signed)
Left message to call Chibuikem Thang at [phone]. 

## 2015-09-01 NOTE — Telephone Encounter (Signed)
Spoke with patient. Patient states that she has not had any new symptoms since her last OV with Melvia Heaps CNM, but it concerned about HSV. "I feel like my life is on hold until I have the labs. I do not want to wait until October." Advised I will speak with the provider regarding have lab work performed and return call. She is agreeable.   Kem Boroughs, FNP okay for patient to be seen for HSV testing? If so please verify labs needed.

## 2015-09-02 NOTE — Telephone Encounter (Signed)
Spoke with patient. She is scheduled for lab appointment on 09/03/2015 at 8:30 am.   Routing to provider for final review. Patient agreeable to disposition. Will close encounter.

## 2015-09-02 NOTE — Telephone Encounter (Signed)
Patient called back

## 2015-09-02 NOTE — Telephone Encounter (Signed)
Left message to call Brylinn Teaney at [phone]. 

## 2015-09-03 ENCOUNTER — Other Ambulatory Visit (INDEPENDENT_AMBULATORY_CARE_PROVIDER_SITE_OTHER): Payer: BLUE CROSS/BLUE SHIELD

## 2015-09-03 DIAGNOSIS — Z1159 Encounter for screening for other viral diseases: Secondary | ICD-10-CM

## 2015-09-08 LAB — HSV(HERPES SMPLX)ABS-I+II(IGG+IGM)-BLD
HSV 1 Glycoprotein G Ab, IgG: <0.9 Index (ref <0.90)
HSV 2 Glycoprotein G Ab, IgG: <0.9 Index (ref <0.90)
Herpes Simplex Vrs I&II-IgM Ab (EIA): 0.32 INDEX

## 2015-09-17 ENCOUNTER — Ambulatory Visit (INDEPENDENT_AMBULATORY_CARE_PROVIDER_SITE_OTHER): Payer: BLUE CROSS/BLUE SHIELD | Admitting: Certified Nurse Midwife

## 2015-09-17 ENCOUNTER — Encounter: Payer: Self-pay | Admitting: Certified Nurse Midwife

## 2015-09-17 VITALS — BP 104/64 | HR 68 | Resp 16 | Ht 64.25 in | Wt 179.0 lb

## 2015-09-17 DIAGNOSIS — Z113 Encounter for screening for infections with a predominantly sexual mode of transmission: Secondary | ICD-10-CM | POA: Diagnosis not present

## 2015-09-17 NOTE — Patient Instructions (Signed)

## 2015-09-17 NOTE — Progress Notes (Signed)
50 y.o. Divorced Caucasian female G1P0010 here with complaint of a bump in vulva area. No tenderness or redness. Describes discharge as none..Onset of symptoms 3 days ago. Denies new personal products or vaginal dryness. Patient had ? HSV outbreak 07/18/15 treated with Valtrex, negative culture, but symptoms improved/resolved after use. Patient has not noted any other areas until this time. Does not feel the same. The areas itched and burned, so symptoms with this. Please check. Also starting a new relationship and would like to Gc/Chlamydia, HIV, RPR screening. Urinary symptoms none . Contraception is Mirena IUD. No other health concerns.   O:Healthy female WDWN Affect: normal, orientation x 3  Exam: Skin warm and dry Abdomen:soft, non tender Inguinal  Inguinal Lymph nodes: no enlargement or tenderness Pelvic exam: External genital: normal female, very tiny bump noted on left lower vulva area, no blister area noted, non tender, culture taken per patient request. Inspection on perineal area, slit like cracking noted under fourchette area to anal area, tender slightly red. HSV culture taken BUS: negative Vagina: normal discharge noted.  No lesions, Cervix: normal, non tender, no CMT, IUD string noted in cervix Uterus: normal, non tender Adnexa:normal, non tender, no masses or fullness noted   A:Normal pelvic exam R/O HSV STD screening   P:Discussed findings on skin and shown to patient in mirror  and ? etiology. Discussed Aveeno or baking soda sitz bath for comfort. Has Valtrex and can restart. Patient concerned culture was negative and should she treat. Discussed wth patient limitations of culture. Prefers trial of OTC Lysine to see if resolves. Patient may restart Valtrex, will advise. Labs: HIV,RPR, GC, Chlamydia. Questions addressed.  Rv prn

## 2015-09-18 LAB — RPR

## 2015-09-18 LAB — HIV ANTIBODY (ROUTINE TESTING W REFLEX): HIV 1&2 Ab, 4th Generation: NONREACTIVE

## 2015-09-19 LAB — HERPES SIMPLEX VIRUS CULTURE: Organism ID, Bacteria: NOT DETECTED

## 2015-09-19 LAB — IPS N GONORRHOEA AND CHLAMYDIA BY PCR

## 2015-09-19 NOTE — Progress Notes (Signed)
Encounter reviewed Karrine Kluttz, MD   

## 2015-11-10 DIAGNOSIS — M5386 Other specified dorsopathies, lumbar region: Secondary | ICD-10-CM | POA: Diagnosis not present

## 2015-11-10 DIAGNOSIS — M9901 Segmental and somatic dysfunction of cervical region: Secondary | ICD-10-CM | POA: Diagnosis not present

## 2015-11-10 DIAGNOSIS — M9903 Segmental and somatic dysfunction of lumbar region: Secondary | ICD-10-CM | POA: Diagnosis not present

## 2015-11-10 DIAGNOSIS — M50322 Other cervical disc degeneration at C5-C6 level: Secondary | ICD-10-CM | POA: Diagnosis not present

## 2015-11-12 DIAGNOSIS — M9903 Segmental and somatic dysfunction of lumbar region: Secondary | ICD-10-CM | POA: Diagnosis not present

## 2015-11-12 DIAGNOSIS — M9901 Segmental and somatic dysfunction of cervical region: Secondary | ICD-10-CM | POA: Diagnosis not present

## 2015-11-12 DIAGNOSIS — M5386 Other specified dorsopathies, lumbar region: Secondary | ICD-10-CM | POA: Diagnosis not present

## 2015-11-12 DIAGNOSIS — M50322 Other cervical disc degeneration at C5-C6 level: Secondary | ICD-10-CM | POA: Diagnosis not present

## 2015-11-13 DIAGNOSIS — M9901 Segmental and somatic dysfunction of cervical region: Secondary | ICD-10-CM | POA: Diagnosis not present

## 2015-11-13 DIAGNOSIS — M50322 Other cervical disc degeneration at C5-C6 level: Secondary | ICD-10-CM | POA: Diagnosis not present

## 2015-11-13 DIAGNOSIS — M9903 Segmental and somatic dysfunction of lumbar region: Secondary | ICD-10-CM | POA: Diagnosis not present

## 2015-11-13 DIAGNOSIS — M5386 Other specified dorsopathies, lumbar region: Secondary | ICD-10-CM | POA: Diagnosis not present

## 2015-11-17 DIAGNOSIS — M50322 Other cervical disc degeneration at C5-C6 level: Secondary | ICD-10-CM | POA: Diagnosis not present

## 2015-11-17 DIAGNOSIS — M5386 Other specified dorsopathies, lumbar region: Secondary | ICD-10-CM | POA: Diagnosis not present

## 2015-11-17 DIAGNOSIS — M9903 Segmental and somatic dysfunction of lumbar region: Secondary | ICD-10-CM | POA: Diagnosis not present

## 2015-11-17 DIAGNOSIS — M5032 Other cervical disc degeneration, mid-cervical region, unspecified level: Secondary | ICD-10-CM | POA: Diagnosis not present

## 2015-11-17 DIAGNOSIS — M9901 Segmental and somatic dysfunction of cervical region: Secondary | ICD-10-CM | POA: Diagnosis not present

## 2015-11-17 DIAGNOSIS — M5136 Other intervertebral disc degeneration, lumbar region: Secondary | ICD-10-CM | POA: Diagnosis not present

## 2015-11-18 DIAGNOSIS — M9903 Segmental and somatic dysfunction of lumbar region: Secondary | ICD-10-CM | POA: Diagnosis not present

## 2015-11-18 DIAGNOSIS — M50322 Other cervical disc degeneration at C5-C6 level: Secondary | ICD-10-CM | POA: Diagnosis not present

## 2015-11-18 DIAGNOSIS — M9901 Segmental and somatic dysfunction of cervical region: Secondary | ICD-10-CM | POA: Diagnosis not present

## 2015-11-18 DIAGNOSIS — M5386 Other specified dorsopathies, lumbar region: Secondary | ICD-10-CM | POA: Diagnosis not present

## 2015-11-24 DIAGNOSIS — M50322 Other cervical disc degeneration at C5-C6 level: Secondary | ICD-10-CM | POA: Diagnosis not present

## 2015-11-24 DIAGNOSIS — M5386 Other specified dorsopathies, lumbar region: Secondary | ICD-10-CM | POA: Diagnosis not present

## 2015-11-24 DIAGNOSIS — M9901 Segmental and somatic dysfunction of cervical region: Secondary | ICD-10-CM | POA: Diagnosis not present

## 2015-11-24 DIAGNOSIS — M9903 Segmental and somatic dysfunction of lumbar region: Secondary | ICD-10-CM | POA: Diagnosis not present

## 2015-11-25 DIAGNOSIS — M5386 Other specified dorsopathies, lumbar region: Secondary | ICD-10-CM | POA: Diagnosis not present

## 2015-11-25 DIAGNOSIS — M9903 Segmental and somatic dysfunction of lumbar region: Secondary | ICD-10-CM | POA: Diagnosis not present

## 2015-11-25 DIAGNOSIS — M50322 Other cervical disc degeneration at C5-C6 level: Secondary | ICD-10-CM | POA: Diagnosis not present

## 2015-11-25 DIAGNOSIS — M9901 Segmental and somatic dysfunction of cervical region: Secondary | ICD-10-CM | POA: Diagnosis not present

## 2015-11-26 DIAGNOSIS — Z23 Encounter for immunization: Secondary | ICD-10-CM | POA: Diagnosis not present

## 2015-11-27 DIAGNOSIS — M9901 Segmental and somatic dysfunction of cervical region: Secondary | ICD-10-CM | POA: Diagnosis not present

## 2015-11-27 DIAGNOSIS — M5386 Other specified dorsopathies, lumbar region: Secondary | ICD-10-CM | POA: Diagnosis not present

## 2015-11-27 DIAGNOSIS — M9903 Segmental and somatic dysfunction of lumbar region: Secondary | ICD-10-CM | POA: Diagnosis not present

## 2015-11-27 DIAGNOSIS — M50322 Other cervical disc degeneration at C5-C6 level: Secondary | ICD-10-CM | POA: Diagnosis not present

## 2015-12-01 DIAGNOSIS — M9903 Segmental and somatic dysfunction of lumbar region: Secondary | ICD-10-CM | POA: Diagnosis not present

## 2015-12-01 DIAGNOSIS — M50322 Other cervical disc degeneration at C5-C6 level: Secondary | ICD-10-CM | POA: Diagnosis not present

## 2015-12-01 DIAGNOSIS — M9901 Segmental and somatic dysfunction of cervical region: Secondary | ICD-10-CM | POA: Diagnosis not present

## 2015-12-01 DIAGNOSIS — M5386 Other specified dorsopathies, lumbar region: Secondary | ICD-10-CM | POA: Diagnosis not present

## 2015-12-02 DIAGNOSIS — M9901 Segmental and somatic dysfunction of cervical region: Secondary | ICD-10-CM | POA: Diagnosis not present

## 2015-12-02 DIAGNOSIS — M5386 Other specified dorsopathies, lumbar region: Secondary | ICD-10-CM | POA: Diagnosis not present

## 2015-12-02 DIAGNOSIS — M50322 Other cervical disc degeneration at C5-C6 level: Secondary | ICD-10-CM | POA: Diagnosis not present

## 2015-12-02 DIAGNOSIS — M9903 Segmental and somatic dysfunction of lumbar region: Secondary | ICD-10-CM | POA: Diagnosis not present

## 2015-12-08 DIAGNOSIS — M9901 Segmental and somatic dysfunction of cervical region: Secondary | ICD-10-CM | POA: Diagnosis not present

## 2015-12-08 DIAGNOSIS — M9903 Segmental and somatic dysfunction of lumbar region: Secondary | ICD-10-CM | POA: Diagnosis not present

## 2015-12-08 DIAGNOSIS — M50322 Other cervical disc degeneration at C5-C6 level: Secondary | ICD-10-CM | POA: Diagnosis not present

## 2015-12-08 DIAGNOSIS — M5386 Other specified dorsopathies, lumbar region: Secondary | ICD-10-CM | POA: Diagnosis not present

## 2015-12-10 ENCOUNTER — Ambulatory Visit: Payer: BLUE CROSS/BLUE SHIELD | Admitting: Certified Nurse Midwife

## 2015-12-11 DIAGNOSIS — M50322 Other cervical disc degeneration at C5-C6 level: Secondary | ICD-10-CM | POA: Diagnosis not present

## 2015-12-11 DIAGNOSIS — M9903 Segmental and somatic dysfunction of lumbar region: Secondary | ICD-10-CM | POA: Diagnosis not present

## 2015-12-11 DIAGNOSIS — M9901 Segmental and somatic dysfunction of cervical region: Secondary | ICD-10-CM | POA: Diagnosis not present

## 2015-12-11 DIAGNOSIS — M5386 Other specified dorsopathies, lumbar region: Secondary | ICD-10-CM | POA: Diagnosis not present

## 2015-12-16 ENCOUNTER — Ambulatory Visit (INDEPENDENT_AMBULATORY_CARE_PROVIDER_SITE_OTHER): Payer: BLUE CROSS/BLUE SHIELD | Admitting: Certified Nurse Midwife

## 2015-12-16 ENCOUNTER — Encounter: Payer: Self-pay | Admitting: Certified Nurse Midwife

## 2015-12-16 VITALS — BP 116/70 | HR 70 | Resp 16 | Ht 64.25 in | Wt 176.0 lb

## 2015-12-16 DIAGNOSIS — Z01419 Encounter for gynecological examination (general) (routine) without abnormal findings: Secondary | ICD-10-CM

## 2015-12-16 DIAGNOSIS — Z0000 Encounter for general adult medical examination without abnormal findings: Secondary | ICD-10-CM

## 2015-12-16 LAB — POCT URINALYSIS DIPSTICK
Bilirubin, UA: NEGATIVE
Blood, UA: NEGATIVE
Glucose, UA: NEGATIVE
Ketones, UA: NEGATIVE
Leukocytes, UA: NEGATIVE
Nitrite, UA: NEGATIVE
Protein, UA: NEGATIVE
Urobilinogen, UA: NEGATIVE
pH, UA: 5

## 2015-12-16 NOTE — Progress Notes (Signed)
50 y.o. G77P0010 Divorced  Caucasian Fe here for annual exam. Perimenopausal with IUD, working well. Occasional spotting with IUD, happy with choice. Not sexually active. Sees PCP for labs and medication management of Flonase/aex. No health issues today.  No LMP recorded. Patient is not currently having periods (Reason: IUD).          Sexually active: No.  The current method of family planning is IUD.    Exercising: Yes.    walking & yoga Smoker:  no  Health Maintenance: Pap:  12-05-14 neg HPV HR neg MMG:  08-15-15 category c density birads 1:neg Colonoscopy:  2009 family hx of colon cancer, f/u 49yrs, not done BMD:   none TDaP:  2011 Shingles: no Pneumonia: no Hep C and HIV: HIV neg 2016, Hep C not done Labs: poct urine-neg Self breast exam: done occ   reports that she quit smoking about 10 years ago. She has never used smokeless tobacco. She reports that she drinks about 4.2 - 4.8 oz of alcohol per week . She reports that she does not use drugs.  Past Medical History:  Diagnosis Date  . AC (acromioclavicular) joint bone spurs    neck  . Arthritis    in the neck  . Depression   . STD (sexually transmitted disease)    chlamydia in college    Past Surgical History:  Procedure Laterality Date  . APPENDECTOMY  1979  . INTRAUTERINE DEVICE INSERTION  10/04, 10/09   old mirena out/ insertion of new Mirena IUD, insertion again 10/14    Current Outpatient Prescriptions  Medication Sig Dispense Refill  . ALPRAZolam (XANAX PO) Take 0.25 mg by mouth as needed.     . fluticasone (FLONASE) 50 MCG/ACT nasal spray Place into both nostrils as needed.     Marland Kitchen ibuprofen (ADVIL,MOTRIN) 800 MG tablet   0  . levonorgestrel (MIRENA) 20 MCG/24HR IUD 1 each by Intrauterine route once.    . Multiple Vitamins-Minerals (MULTIVITAMIN PO) Take by mouth daily.    . Naproxen Sodium (ALEVE PO) Take by mouth as needed.     No current facility-administered medications for this visit.     Family History   Problem Relation Age of Onset  . Endometriosis Mother   . Hypertension Father   . Diverticulitis Father   . Leukemia Maternal Grandmother   . Cancer Paternal Grandfather     colon cancer    ROS:  Pertinent items are noted in HPI.  Otherwise, a comprehensive ROS was negative.  Exam:   BP 116/70   Pulse 70   Resp 16   Ht 5' 4.25" (1.632 m)   Wt 176 lb (79.8 kg)   BMI 29.98 kg/m  Height: 5' 4.25" (163.2 cm) Ht Readings from Last 3 Encounters:  12/16/15 5' 4.25" (1.632 m)  09/17/15 5' 4.25" (1.632 m)  12/05/14 5' 4.25" (1.632 m)    General appearance: alert, cooperative and appears stated age Head: Normocephalic, without obvious abnormality, atraumatic Neck: no adenopathy, supple, symmetrical, trachea midline and thyroid normal to inspection and palpation Lungs: clear to auscultation bilaterally Breasts: normal appearance, no masses or tenderness, No nipple retraction or dimpling, No nipple discharge or bleeding, No axillary or supraclavicular adenopathy Heart: regular rate and rhythm Abdomen: soft, non-tender; no masses,  no organomegaly Extremities: extremities normal, atraumatic, no cyanosis or edema Skin: Skin color, texture, turgor normal. No rashes or lesions Lymph nodes: Cervical, supraclavicular, and axillary nodes normal. No abnormal inguinal nodes palpated Neurologic: Grossly normal  Pelvic: External genitalia:  no lesions              Urethra:  normal appearing urethra with no masses, tenderness or lesions              Bartholin's and Skene's: normal                 Vagina: normal appearing vagina with normal color and discharge, no lesions              Cervix: no bleeding following Pap, no cervical motion tenderness and no lesions  IUD noted in cervix              Pap taken: No. Bimanual Exam:  Uterus:  normal size, contour, position, consistency, mobility, non-tender and anteverted              Adnexa: normal adnexa and no mass, fullness, tenderness                Rectovaginal: Confirms               Anus:  normal sphincter tone, no lesions  Chaperone present: yes  A:  Well Woman with normal exam  Contraception Mirena IUD   P:   Reviewed health and wellness pertinent to exam  Warning signs of IUD reviewed along with risk and benefits. Questions addressed.  Continue follow up with MD as indicated  Pap smear as above not taken   counseled on breast self exam, mammography screening, adequate intake of calcium and vitamin D, diet and exercise  return annually or prn  An After Visit Summary was printed and given to the patient.

## 2015-12-16 NOTE — Patient Instructions (Signed)

## 2015-12-17 DIAGNOSIS — M50322 Other cervical disc degeneration at C5-C6 level: Secondary | ICD-10-CM | POA: Diagnosis not present

## 2015-12-17 DIAGNOSIS — M5386 Other specified dorsopathies, lumbar region: Secondary | ICD-10-CM | POA: Diagnosis not present

## 2015-12-17 DIAGNOSIS — M9903 Segmental and somatic dysfunction of lumbar region: Secondary | ICD-10-CM | POA: Diagnosis not present

## 2015-12-17 DIAGNOSIS — M9901 Segmental and somatic dysfunction of cervical region: Secondary | ICD-10-CM | POA: Diagnosis not present

## 2015-12-22 DIAGNOSIS — M9901 Segmental and somatic dysfunction of cervical region: Secondary | ICD-10-CM | POA: Diagnosis not present

## 2015-12-22 DIAGNOSIS — M5386 Other specified dorsopathies, lumbar region: Secondary | ICD-10-CM | POA: Diagnosis not present

## 2015-12-22 DIAGNOSIS — M50322 Other cervical disc degeneration at C5-C6 level: Secondary | ICD-10-CM | POA: Diagnosis not present

## 2015-12-22 DIAGNOSIS — M9903 Segmental and somatic dysfunction of lumbar region: Secondary | ICD-10-CM | POA: Diagnosis not present

## 2015-12-22 NOTE — Progress Notes (Signed)
Encounter reviewed Parilee Hally, MD   

## 2015-12-24 DIAGNOSIS — M9901 Segmental and somatic dysfunction of cervical region: Secondary | ICD-10-CM | POA: Diagnosis not present

## 2015-12-24 DIAGNOSIS — M5386 Other specified dorsopathies, lumbar region: Secondary | ICD-10-CM | POA: Diagnosis not present

## 2015-12-24 DIAGNOSIS — M50322 Other cervical disc degeneration at C5-C6 level: Secondary | ICD-10-CM | POA: Diagnosis not present

## 2015-12-24 DIAGNOSIS — M9903 Segmental and somatic dysfunction of lumbar region: Secondary | ICD-10-CM | POA: Diagnosis not present

## 2015-12-31 DIAGNOSIS — M9901 Segmental and somatic dysfunction of cervical region: Secondary | ICD-10-CM | POA: Diagnosis not present

## 2015-12-31 DIAGNOSIS — M5386 Other specified dorsopathies, lumbar region: Secondary | ICD-10-CM | POA: Diagnosis not present

## 2015-12-31 DIAGNOSIS — M50322 Other cervical disc degeneration at C5-C6 level: Secondary | ICD-10-CM | POA: Diagnosis not present

## 2015-12-31 DIAGNOSIS — M9903 Segmental and somatic dysfunction of lumbar region: Secondary | ICD-10-CM | POA: Diagnosis not present

## 2016-01-14 DIAGNOSIS — M5386 Other specified dorsopathies, lumbar region: Secondary | ICD-10-CM | POA: Diagnosis not present

## 2016-01-14 DIAGNOSIS — M50322 Other cervical disc degeneration at C5-C6 level: Secondary | ICD-10-CM | POA: Diagnosis not present

## 2016-01-14 DIAGNOSIS — M9903 Segmental and somatic dysfunction of lumbar region: Secondary | ICD-10-CM | POA: Diagnosis not present

## 2016-01-14 DIAGNOSIS — M9901 Segmental and somatic dysfunction of cervical region: Secondary | ICD-10-CM | POA: Diagnosis not present

## 2016-01-28 DIAGNOSIS — M9901 Segmental and somatic dysfunction of cervical region: Secondary | ICD-10-CM | POA: Diagnosis not present

## 2016-01-28 DIAGNOSIS — M5386 Other specified dorsopathies, lumbar region: Secondary | ICD-10-CM | POA: Diagnosis not present

## 2016-01-28 DIAGNOSIS — M9903 Segmental and somatic dysfunction of lumbar region: Secondary | ICD-10-CM | POA: Diagnosis not present

## 2016-01-28 DIAGNOSIS — M50322 Other cervical disc degeneration at C5-C6 level: Secondary | ICD-10-CM | POA: Diagnosis not present

## 2016-03-01 DIAGNOSIS — Z0000 Encounter for general adult medical examination without abnormal findings: Secondary | ICD-10-CM | POA: Diagnosis not present

## 2016-03-01 DIAGNOSIS — Z1212 Encounter for screening for malignant neoplasm of rectum: Secondary | ICD-10-CM | POA: Diagnosis not present

## 2016-03-01 DIAGNOSIS — R8299 Other abnormal findings in urine: Secondary | ICD-10-CM | POA: Diagnosis not present

## 2016-03-08 DIAGNOSIS — Z1389 Encounter for screening for other disorder: Secondary | ICD-10-CM | POA: Diagnosis not present

## 2016-03-08 DIAGNOSIS — Z0000 Encounter for general adult medical examination without abnormal findings: Secondary | ICD-10-CM | POA: Diagnosis not present

## 2016-03-08 DIAGNOSIS — Z86010 Personal history of colonic polyps: Secondary | ICD-10-CM | POA: Diagnosis not present

## 2016-03-08 DIAGNOSIS — F418 Other specified anxiety disorders: Secondary | ICD-10-CM | POA: Diagnosis not present

## 2016-03-08 DIAGNOSIS — J302 Other seasonal allergic rhinitis: Secondary | ICD-10-CM | POA: Diagnosis not present

## 2016-03-08 DIAGNOSIS — E784 Other hyperlipidemia: Secondary | ICD-10-CM | POA: Diagnosis not present

## 2016-03-24 ENCOUNTER — Encounter: Payer: Self-pay | Admitting: Certified Nurse Midwife

## 2016-05-30 DIAGNOSIS — J069 Acute upper respiratory infection, unspecified: Secondary | ICD-10-CM | POA: Diagnosis not present

## 2016-09-15 DIAGNOSIS — J302 Other seasonal allergic rhinitis: Secondary | ICD-10-CM | POA: Diagnosis not present

## 2016-09-15 DIAGNOSIS — E784 Other hyperlipidemia: Secondary | ICD-10-CM | POA: Diagnosis not present

## 2016-09-15 DIAGNOSIS — Z6828 Body mass index (BMI) 28.0-28.9, adult: Secondary | ICD-10-CM | POA: Diagnosis not present

## 2016-10-01 ENCOUNTER — Other Ambulatory Visit: Payer: Self-pay | Admitting: Internal Medicine

## 2016-10-01 DIAGNOSIS — Z1231 Encounter for screening mammogram for malignant neoplasm of breast: Secondary | ICD-10-CM

## 2016-10-25 ENCOUNTER — Ambulatory Visit
Admission: RE | Admit: 2016-10-25 | Discharge: 2016-10-25 | Disposition: A | Discharge status: Home / Self Care | Payer: BLUE CROSS/BLUE SHIELD | Source: Ambulatory Visit | Attending: Internal Medicine | Admitting: Internal Medicine

## 2016-10-25 DIAGNOSIS — Z1231 Encounter for screening mammogram for malignant neoplasm of breast: Secondary | ICD-10-CM | POA: Diagnosis not present

## 2016-12-16 ENCOUNTER — Ambulatory Visit (INDEPENDENT_AMBULATORY_CARE_PROVIDER_SITE_OTHER): Payer: BLUE CROSS/BLUE SHIELD | Admitting: Certified Nurse Midwife

## 2016-12-16 ENCOUNTER — Encounter: Payer: Self-pay | Admitting: Certified Nurse Midwife

## 2016-12-16 ENCOUNTER — Other Ambulatory Visit (HOSPITAL_COMMUNITY)
Admission: RE | Admit: 2016-12-16 | Discharge: 2016-12-16 | Disposition: A | Discharge status: Home / Self Care | Payer: BLUE CROSS/BLUE SHIELD | Source: Ambulatory Visit | Attending: Certified Nurse Midwife | Admitting: Certified Nurse Midwife

## 2016-12-16 VITALS — BP 110/76 | HR 70 | Resp 16 | Ht 64.25 in | Wt 175.0 lb

## 2016-12-16 DIAGNOSIS — Z30431 Encounter for routine checking of intrauterine contraceptive device: Secondary | ICD-10-CM | POA: Diagnosis not present

## 2016-12-16 DIAGNOSIS — Z124 Encounter for screening for malignant neoplasm of cervix: Secondary | ICD-10-CM

## 2016-12-16 DIAGNOSIS — Z01419 Encounter for gynecological examination (general) (routine) without abnormal findings: Secondary | ICD-10-CM

## 2016-12-16 DIAGNOSIS — R87610 Atypical squamous cells of undetermined significance on cytologic smear of cervix (ASC-US): Secondary | ICD-10-CM | POA: Diagnosis not present

## 2016-12-16 NOTE — Patient Instructions (Signed)

## 2016-12-16 NOTE — Progress Notes (Signed)
51 y.o. G18P0010 Divorced  Caucasian Fe here for annual exam. IUD working well, no periods. Occasional hot flash, but no night sweats. Sees PCP Dr. Dagmar Hait yearly for aex/labs, and cholesterol management with diet only.Weight staying same and working on eating healthy. Sexually active, no partner change, no STD concerns. No other health concerns today. Leaving to go to [place] for [employer]!  No LMP recorded. Patient is not currently having periods (Reason: IUD).         mirena inserted 10/14 Sexually active: Yes.    The current method of family planning is IUD.    Exercising: No.  exercise Smoker:  no  Health Maintenance: Pap:  12-05-14 neg HPV HR neg History of Abnormal Pap: yes MMG:  10-25-16 category c density birads 1:neg Self Breast exams: no Colonoscopy:  2009 family hx of colon cancer, f/u 28yrs due BMD:  none TDaP:  2011 Shingles: no Pneumonia: no Hep C and HIV: HIV neg 2017, Hep c maybe with pcp Labs: no   reports that she quit smoking about 11 years ago. She has never used smokeless tobacco. She reports that she drinks about 2.4 - 3.0 oz of alcohol per week . She reports that she does not use drugs.  Past Medical History:  Diagnosis Date  . AC (acromioclavicular) joint bone spurs    neck  . Arthritis    in the neck  . Depression   . STD (sexually transmitted disease)    chlamydia in college    Past Surgical History:  Procedure Laterality Date  . APPENDECTOMY  1979  . INTRAUTERINE DEVICE INSERTION  10/04, 10/09   old mirena out/ insertion of new Mirena IUD, insertion again 10/14    Current Outpatient Prescriptions  Medication Sig Dispense Refill  . ALPRAZolam (XANAX PO) Take 0.25 mg by mouth as needed.     . fexofenadine (ALLEGRA) 180 MG tablet Take 180 mg by mouth daily.    . fluticasone (FLONASE) 50 MCG/ACT nasal spray Place into both nostrils as needed.     Marland Kitchen ibuprofen (ADVIL,MOTRIN) 800 MG tablet   0  . levonorgestrel (MIRENA) 20 MCG/24HR IUD 1 each by  Intrauterine route once.    . montelukast (SINGULAIR) 10 MG tablet   4  . Multiple Vitamins-Minerals (MULTIVITAMIN PO) Take by mouth daily.    . Naproxen Sodium (ALEVE PO) Take by mouth as needed.     No current facility-administered medications for this visit.     Family History  Problem Relation Age of Onset  . Endometriosis Mother   . Hypertension Father   . Diverticulitis Father   . Leukemia Maternal Grandmother   . Cancer Paternal Grandfather        colon cancer  . Breast cancer Neg Hx     ROS:  Pertinent items are noted in HPI.  Otherwise, a comprehensive ROS was negative.  Exam:   BP 110/76   Pulse 70   Resp 16   Ht 5' 4.25" (1.632 m)   Wt 175 lb (79.4 kg)   BMI 29.81 kg/m  Height: 5' 4.25" (163.2 cm) Ht Readings from Last 3 Encounters:  12/16/16 5' 4.25" (1.632 m)  12/16/15 5' 4.25" (1.632 m)  09/17/15 5' 4.25" (1.632 m)    General appearance: alert, cooperative and appears stated age Head: Normocephalic, without obvious abnormality, atraumatic Neck: no adenopathy, supple, symmetrical, trachea midline and thyroid normal to inspection and palpation Lungs: clear to auscultation bilaterally Breasts: normal appearance, no masses or tenderness, No nipple  retraction or dimpling, No nipple discharge or bleeding, No axillary or supraclavicular adenopathy Heart: regular rate and rhythm Abdomen: soft, non-tender; no masses,  no organomegaly Extremities: extremities normal, atraumatic, no cyanosis or edema Skin: Skin color, texture, turgor normal. No rashes or lesions Lymph nodes: Cervical, supraclavicular, and axillary nodes normal. No abnormal inguinal nodes palpated Neurologic: Grossly normal   Pelvic: External genitalia:  no lesions              Urethra:  normal appearing urethra with no masses, tenderness or lesions              Bartholin's and Skene's: normal                 Vagina: normal appearing vagina with normal color and discharge, no lesions               Cervix: no cervical motion tenderness, no lesions and iud string noted in cervix, appropriate length              Pap taken: Yes.   Bimanual Exam:  Uterus:  normal size, contour, position, consistency, mobility, non-tender              Adnexa: normal adnexa and no mass, fullness, tenderness               Rectovaginal: Confirms               Anus:  normal sphincter tone, no lesions  Chaperone present: yes  A:  Well Woman with normal exam  Contraception Mirena IUD due for removal 9/19, plans exchange if not menopausal  Colonoscopy overdue,   Cholesterol management with PCP    P:   Reviewed health and wellness pertinent to exam  Warning signs with IUD reviewed and need to advise. Discussed calling in 9/19 to schedule exchange of IUD prior to expiration in 10/19.   Discussed risks and benefits, patient is aware and will schedule. She has information.   Continue with PCP as indicated  Pap smear: yes   counseled on breast self exam, mammography screening, STD prevention, HIV risk factors and prevention, adequate intake of calcium and vitamin D, diet and exercise  return annually or prn  An After Visit Summary was printed and given to the patient.

## 2016-12-21 ENCOUNTER — Other Ambulatory Visit: Payer: Self-pay | Admitting: Certified Nurse Midwife

## 2016-12-21 DIAGNOSIS — R87610 Atypical squamous cells of undetermined significance on cytologic smear of cervix (ASC-US): Secondary | ICD-10-CM

## 2016-12-21 LAB — CYTOLOGY - PAP
Diagnosis: UNDETERMINED — AB
HPV: DETECTED — AB

## 2017-01-11 ENCOUNTER — Other Ambulatory Visit: Payer: Self-pay

## 2017-01-11 ENCOUNTER — Encounter: Payer: Self-pay | Admitting: Certified Nurse Midwife

## 2017-01-11 ENCOUNTER — Ambulatory Visit (INDEPENDENT_AMBULATORY_CARE_PROVIDER_SITE_OTHER): Payer: BLUE CROSS/BLUE SHIELD | Admitting: Certified Nurse Midwife

## 2017-01-11 VITALS — BP 120/78 | HR 70 | Resp 16 | Ht 64.25 in | Wt 180.0 lb

## 2017-01-11 DIAGNOSIS — B977 Papillomavirus as the cause of diseases classified elsewhere: Secondary | ICD-10-CM | POA: Diagnosis not present

## 2017-01-11 DIAGNOSIS — N870 Mild cervical dysplasia: Secondary | ICD-10-CM | POA: Diagnosis not present

## 2017-01-11 DIAGNOSIS — R87610 Atypical squamous cells of undetermined significance on cytologic smear of cervix (ASC-US): Secondary | ICD-10-CM

## 2017-01-11 NOTE — Patient Instructions (Signed)

## 2017-01-11 NOTE — Progress Notes (Addendum)
Patient ID: Jaime Rogers, female   DOB: 12/02/1965, 51 y.o.   MRN: [id number]  Chief Complaint  Patient presents with  . Colposcopy    //jj    HPI Jaime Rogers is a 51 y.o. female.   Patient here for colposcopy exam. Denies vaginal bleeding or pelvic pain. Contraception Mirena IUD    Indications: Pap smear on 12/16/16 showed: ASCUS with POSITIVE high risk HPV. Previous colposcopy:  In the 90's. Prior cervical treatment: no treatment.  Past Medical History:  Diagnosis Date  . Abnormal Pap smear of cervix    early 90's & 12-16-16 ASCUS HPV HR +  . AC (acromioclavicular) joint bone spurs    neck  . Arthritis    in the neck  . Depression   . STD (sexually transmitted disease)    chlamydia in college    Past Surgical History:  Procedure Laterality Date  . APPENDECTOMY  1979  . COLPOSCOPY     early 6's  . INTRAUTERINE DEVICE INSERTION  10/04, 10/09   old mirena out/ insertion of new Mirena IUD, insertion again 10/14    Family History  Problem Relation Age of Onset  . Endometriosis Mother   . Hypertension Father   . Diverticulitis Father   . Leukemia Maternal Grandmother   . Cancer Paternal Grandfather        colon cancer  . Breast cancer Neg Hx     Social History Social History   Tobacco Use  . Smoking status: Former Smoker    Last attempt to quit: 08/28/2005    Years since quitting: 11.3  . Smokeless tobacco: Never Used  Substance Use Topics  . Alcohol use: Yes    Alcohol/week: 2.4 - 3.0 oz    Types: 4 - 5 Standard drinks or equivalent per week  . Drug use: No    Allergies  Allergen Reactions  . Minocycline Hives    Current Outpatient Medications  Medication Sig Dispense Refill  . ALPRAZolam (XANAX PO) Take 0.25 mg by mouth as needed.     . fexofenadine (ALLEGRA) 180 MG tablet Take 180 mg by mouth daily.    . fluticasone (FLONASE) 50 MCG/ACT nasal spray Place into both nostrils as needed.     Marland Kitchen ibuprofen (ADVIL,MOTRIN) 800 MG tablet   0  .  levonorgestrel (MIRENA) 20 MCG/24HR IUD 1 each by Intrauterine route once.    . montelukast (SINGULAIR) 10 MG tablet   4  . Multiple Vitamins-Minerals (MULTIVITAMIN PO) Take by mouth daily.    . Naproxen Sodium (ALEVE PO) Take by mouth as needed.     No current facility-administered medications for this visit.     Review of Systems Review of Systems  Constitutional: Negative.   Respiratory: Negative.   Cardiovascular: Negative.   Gastrointestinal: Negative.  Negative for abdominal pain and vomiting.  Genitourinary: Negative for frequency, genital sores, pelvic pain, vaginal bleeding, vaginal discharge and vaginal pain.  Skin: Negative.   Neurological: Negative.   Psychiatric/Behavioral: Negative.     Blood pressure 120/78, pulse 70, resp. rate 16, height 5' 4.25" (1.632 m), weight 180 lb (81.6 kg).  Physical Exam Physical Exam  Constitutional: She appears well-developed and well-nourished.  Genitourinary: There is no rash, tenderness, lesion or injury on the right labia. There is no rash, tenderness, lesion or injury on the left labia. Uterus is not enlarged and not tender. Cervix exhibits discharge. Cervix exhibits no motion tenderness and no friability. There is bleeding in the vagina. No tenderness  in the vagina. No signs of injury around the vagina. No vaginal discharge found.    Lymphadenopathy:       Right: No inguinal adenopathy present.       Left: No inguinal adenopathy present.  Skin: Skin is warm and dry.  Psychiatric: She has a normal mood and affect. Her behavior is normal. Judgment and thought content normal.    Data Reviewed Reviewed pap smear results and HPVHR+. Questions addressed regarding HPV>  Assessment    Procedure Details  The risks and benefits of the procedure and Written informed consent obtained. Limitations of exam discussed.  Speculum placed in vagina and excellent visualization of cervix achieved, cervix swabbed x 3 with  Saline and acetic  acid solution. Cervix viewed with 3.75, 7.5 and 15# and green filter. Acetowhite area noted at 6 o'clock and 8 o'clock. Lugol's applied with non staining noted in same area. Biopsy taken of cervix at 6 and 8 o'clock. ECC obtained. IUD string intact in cervical os.  Monsel's applied. No bleeding noted on removal of speculum. Instructions verbal and written given to patient. Patient tolerated procedure well.  Specimens: 3  Complications: none.     Plan    Specimens labelled and sent to Pathology. Patient will be called with results are in and reviewed. Pathology showed LSIL and CIN 1 with  Biopsy at 6 o'clock, Koilocytic atypia consistent with HPVHR with biopsy at 8 o'clock ECC benign endocervical mucosa Patient will be notified of results and aware she needs repeat pap smear in one year. Pap recall 08      [facility] 01/11/2017, 10:50 AM

## 2017-01-11 NOTE — Progress Notes (Signed)
12-16-16 ASCUS HPV HR+ Previous abnormal history in the 90's. Pt took 1 aleve earlier this morning at 7:30am

## 2017-03-07 DIAGNOSIS — Z0000 Encounter for general adult medical examination without abnormal findings: Secondary | ICD-10-CM | POA: Diagnosis not present

## 2017-03-07 DIAGNOSIS — E7849 Other hyperlipidemia: Secondary | ICD-10-CM | POA: Diagnosis not present

## 2017-03-14 DIAGNOSIS — Z6830 Body mass index (BMI) 30.0-30.9, adult: Secondary | ICD-10-CM | POA: Diagnosis not present

## 2017-03-14 DIAGNOSIS — Z1389 Encounter for screening for other disorder: Secondary | ICD-10-CM | POA: Diagnosis not present

## 2017-03-14 DIAGNOSIS — E7849 Other hyperlipidemia: Secondary | ICD-10-CM | POA: Diagnosis not present

## 2017-03-14 DIAGNOSIS — Z0000 Encounter for general adult medical examination without abnormal findings: Secondary | ICD-10-CM | POA: Diagnosis not present

## 2017-03-14 DIAGNOSIS — J302 Other seasonal allergic rhinitis: Secondary | ICD-10-CM | POA: Diagnosis not present

## 2017-06-15 DIAGNOSIS — Z1211 Encounter for screening for malignant neoplasm of colon: Secondary | ICD-10-CM | POA: Diagnosis not present

## 2017-06-15 DIAGNOSIS — Z1212 Encounter for screening for malignant neoplasm of rectum: Secondary | ICD-10-CM | POA: Diagnosis not present

## 2017-10-10 ENCOUNTER — Ambulatory Visit: Payer: BLUE CROSS/BLUE SHIELD | Admitting: Podiatry

## 2017-10-13 ENCOUNTER — Ambulatory Visit (INDEPENDENT_AMBULATORY_CARE_PROVIDER_SITE_OTHER): Payer: BLUE CROSS/BLUE SHIELD

## 2017-10-13 ENCOUNTER — Ambulatory Visit: Payer: BLUE CROSS/BLUE SHIELD | Admitting: Podiatry

## 2017-10-13 ENCOUNTER — Encounter: Payer: Self-pay | Admitting: Podiatry

## 2017-10-13 VITALS — BP 110/68 | HR 59 | Resp 16

## 2017-10-13 DIAGNOSIS — M722 Plantar fascial fibromatosis: Secondary | ICD-10-CM

## 2017-10-13 DIAGNOSIS — M7661 Achilles tendinitis, right leg: Secondary | ICD-10-CM

## 2017-10-13 MED ORDER — METHYLPREDNISOLONE 4 MG PO TBPK: ORAL_TABLET | ORAL | 0 refills | Status: DC

## 2017-10-13 MED ORDER — MELOXICAM 15 MG PO TABS: 15.0000 mg | ORAL_TABLET | Freq: Every day | ORAL | 3 refills | Status: DC

## 2017-10-13 NOTE — Patient Instructions (Signed)
For instructions on how to put on your Night Splint, please visit [email or web address] For instructions on how to put on your Plantar Fascial Brace, please visit [email or web address]   Plantar Fasciitis (Heel Spur Syndrome) with Rehab The plantar fascia is a fibrous, ligament-like, soft-tissue structure that spans the bottom of the foot. Plantar fasciitis is a condition that causes pain in the foot due to inflammation of the tissue. SYMPTOMS   Pain and tenderness on the underneath side of the foot.  Pain that worsens with standing or walking. CAUSES  Plantar fasciitis is caused by irritation and injury to the plantar fascia on the underneath side of the foot. Common mechanisms of injury include:  Direct trauma to bottom of the foot.  Damage to a small nerve that runs under the foot where the main fascia attaches to the heel bone.  Stress placed on the plantar fascia due to bone spurs. RISK INCREASES WITH:   Activities that place stress on the plantar fascia (running, jumping, pivoting, or cutting).  Poor strength and flexibility.  Improperly fitted shoes.  Tight calf muscles.  Flat feet.  Failure to warm-up properly before activity.  Obesity. PREVENTION  Warm up and stretch properly before activity.  Allow for adequate recovery between workouts.  Maintain physical fitness:  Strength, flexibility, and endurance.  Cardiovascular fitness.  Maintain a health body weight.  Avoid stress on the plantar fascia.  Wear properly fitted shoes, including arch supports for individuals who have flat feet.  PROGNOSIS  If treated properly, then the symptoms of plantar fasciitis usually resolve without surgery. However, occasionally surgery is necessary.  RELATED COMPLICATIONS   Recurrent symptoms that may result in a chronic condition.  Problems of the lower back that are caused by compensating for the injury, such as limping.  Pain or weakness of the foot during  push-off following surgery.  Chronic inflammation, scarring, and partial or complete fascia tear, occurring more often from repeated injections.  TREATMENT  Treatment initially involves the use of ice and medication to help reduce pain and inflammation. The use of strengthening and stretching exercises may help reduce pain with activity, especially stretches of the Achilles tendon. These exercises may be performed at home or with a therapist. Your caregiver may recommend that you use heel cups of arch supports to help reduce stress on the plantar fascia. Occasionally, corticosteroid injections are given to reduce inflammation. If symptoms persist for greater than 6 months despite non-surgical (conservative), then surgery may be recommended.   MEDICATION   If pain medication is necessary, then nonsteroidal anti-inflammatory medications, such as aspirin and ibuprofen, or other minor pain relievers, such as acetaminophen, are often recommended.  Do not take pain medication within 7 days before surgery.  Prescription pain relievers may be given if deemed necessary by your caregiver. Use only as directed and only as much as you need.  Corticosteroid injections may be given by your caregiver. These injections should be reserved for the most serious cases, because they may only be given a certain number of times.  HEAT AND COLD  Cold treatment (icing) relieves pain and reduces inflammation. Cold treatment should be applied for 10 to 15 minutes every 2 to 3 hours for inflammation and pain and immediately after any activity that aggravates your symptoms. Use ice packs or massage the area with a piece of ice (ice massage).  Heat treatment may be used prior to performing the stretching and strengthening activities prescribed by your caregiver, physical therapist, or athletic trainer. Use a  heat pack or soak the injury in warm water.  SEEK IMMEDIATE MEDICAL CARE IF:  Treatment seems to offer no benefit,  or the condition worsens.  Any medications produce adverse side effects.  EXERCISES- RANGE OF MOTION (ROM) AND STRETCHING EXERCISES - Plantar Fasciitis (Heel Spur Syndrome) These exercises may help you when beginning to rehabilitate your injury. Your symptoms may resolve with or without further involvement from your physician, physical therapist or athletic trainer. While completing these exercises, remember:   Restoring tissue flexibility helps normal motion to return to the joints. This allows healthier, less painful movement and activity.  An effective stretch should be held for at least 30 seconds.  A stretch should never be painful. You should only feel a gentle lengthening or release in the stretched tissue.  RANGE OF MOTION - Toe Extension, Flexion  Sit with your right / left leg crossed over your opposite knee.  Grasp your toes and gently pull them back toward the top of your foot. You should feel a stretch on the bottom of your toes and/or foot.  Hold this stretch for 10 seconds.  Now, gently pull your toes toward the bottom of your foot. You should feel a stretch on the top of your toes and or foot.  Hold this stretch for 10 seconds. Repeat  times. Complete this stretch 3 times per day.   RANGE OF MOTION - Ankle Dorsiflexion, Active Assisted  Remove shoes and sit on a chair that is preferably not on a carpeted surface.  Place right / left foot under knee. Extend your opposite leg for support.  Keeping your heel down, slide your right / left foot back toward the chair until you feel a stretch at your ankle or calf. If you do not feel a stretch, slide your bottom forward to the edge of the chair, while still keeping your heel down.  Hold this stretch for 10 seconds. Repeat 3 times. Complete this stretch 2 times per day.   STRETCH  Gastroc, Standing  Place hands on wall.  Extend right / left leg, keeping the front knee somewhat bent.  Slightly point your toes inward  on your back foot.  Keeping your right / left heel on the floor and your knee straight, shift your weight toward the wall, not allowing your back to arch.  You should feel a gentle stretch in the right / left calf. Hold this position for 10 seconds. Repeat 3 times. Complete this stretch 2 times per day.  STRETCH  Soleus, Standing  Place hands on wall.  Extend right / left leg, keeping the other knee somewhat bent.  Slightly point your toes inward on your back foot.  Keep your right / left heel on the floor, bend your back knee, and slightly shift your weight over the back leg so that you feel a gentle stretch deep in your back calf.  Hold this position for 10 seconds. Repeat 3 times. Complete this stretch 2 times per day.  STRETCH  Gastrocsoleus, Standing  Note: This exercise can place a lot of stress on your foot and ankle. Please complete this exercise only if specifically instructed by your caregiver.   Place the ball of your right / left foot on a step, keeping your other foot firmly on the same step.  Hold on to the wall or a rail for balance.  Slowly lift your other foot, allowing your body weight to press your heel down over the edge of the step.  You  should feel a stretch in your right / left calf.  Hold this position for 10 seconds.  Repeat this exercise with a slight bend in your right / left knee. Repeat 3 times. Complete this stretch 2 times per day.   STRENGTHENING EXERCISES - Plantar Fasciitis (Heel Spur Syndrome)  These exercises may help you when beginning to rehabilitate your injury. They may resolve your symptoms with or without further involvement from your physician, physical therapist or athletic trainer. While completing these exercises, remember:   Muscles can gain both the endurance and the strength needed for everyday activities through controlled exercises.  Complete these exercises as instructed by your physician, physical therapist or athletic  trainer. Progress the resistance and repetitions only as guided.  STRENGTH - Towel Curls  Sit in a chair positioned on a non-carpeted surface.  Place your foot on a towel, keeping your heel on the floor.  Pull the towel toward your heel by only curling your toes. Keep your heel on the floor. Repeat 3 times. Complete this exercise 2 times per day.  STRENGTH - Ankle Inversion  Secure one end of a rubber exercise band/tubing to a fixed object (table, pole). Loop the other end around your foot just before your toes.  Place your fists between your knees. This will focus your strengthening at your ankle.  Slowly, pull your big toe up and in, making sure the band/tubing is positioned to resist the entire motion.  Hold this position for 10 seconds.  Have your muscles resist the band/tubing as it slowly pulls your foot back to the starting position. Repeat 3 times. Complete this exercises 2 times per day.  Document Released: 02/01/2005 Document Revised: 04/26/2011 Document Reviewed: 05/16/2008 [facility] Patient Information 2014 [place], [place].

## 2017-10-13 NOTE — Progress Notes (Signed)
Subjective:  Patient ID: Huntley Dec, female    DOB: May 13, 1965,  MRN: [id number] HPI Chief Complaint  Patient presents with  . Foot Pain    Plantar heel left and plantar and posterior heel right - aching x 1 month, AM pain, taking aleve and stretching-no help  . New Patient (Initial Visit)    52 y.o. female presents with the above complaint.   ROS: Denies fever chills nausea vomiting muscle aches pains calf pain back pain chest pain shortness of breath.  Past Medical History:  Diagnosis Date  . Abnormal Pap smear of cervix    early 90's & 12-16-16 ASCUS HPV HR +  . AC (acromioclavicular) joint bone spurs    neck  . Arthritis    in the neck  . Depression   . STD (sexually transmitted disease)    chlamydia in college   Past Surgical History:  Procedure Laterality Date  . APPENDECTOMY  1979  . COLPOSCOPY     early 61's  . INTRAUTERINE DEVICE INSERTION  10/04, 10/09   old mirena out/ insertion of new Mirena IUD, insertion again 10/14    Current Outpatient Medications:  .  ALPRAZolam (XANAX) 0.25 MG tablet, , Disp: , Rfl: 1 .  fexofenadine (ALLEGRA) 180 MG tablet, Take 180 mg by mouth daily., Disp: , Rfl:  .  levonorgestrel (MIRENA) 20 MCG/24HR IUD, 1 each by Intrauterine route once., Disp: , Rfl:  .  meloxicam (MOBIC) 15 MG tablet, Take 1 tablet (15 mg total) by mouth daily., Disp: 30 tablet, Rfl: 3 .  methylPREDNISolone (MEDROL DOSEPAK) 4 MG TBPK tablet, 6 day dose pack - take as directed, Disp: 21 tablet, Rfl: 0 .  montelukast (SINGULAIR) 10 MG tablet, , Disp: , Rfl: 4 .  Multiple Vitamins-Minerals (MULTIVITAMIN PO), Take by mouth daily., Disp: , Rfl:  .  Naproxen Sodium (ALEVE PO), Take by mouth as needed., Disp: , Rfl:   Allergies  Allergen Reactions  . Minocycline Hives   Review of Systems Objective:   Vitals:   10/13/17 1600  BP: 110/68  Pulse: (!) 59  Resp: 16    General: Well developed, nourished, in no acute distress, alert and oriented x3    Dermatological: Skin is warm, dry and supple bilateral. Nails x 10 are well maintained; remaining integument appears unremarkable at this time. There are no open sores, no preulcerative lesions, no rash or signs of infection present.  Vascular: Dorsalis Pedis artery and Posterior Tibial artery pedal pulses are 2/4 bilateral with immedate capillary fill time. Pedal hair growth present. No varicosities and no lower extremity edema present bilateral.   Neruologic: Grossly intact via light touch bilateral. Vibratory intact via tuning fork bilateral. Protective threshold with Semmes Wienstein monofilament intact to all pedal sites bilateral. Patellar and Achilles deep tendon reflexes 2+ bilateral. No Babinski or clonus noted bilateral.   Musculoskeletal: No gross boney pedal deformities bilateral. No pain, crepitus, or limitation noted with foot and ankle range of motion bilateral. Muscular strength 5/5 in all groups tested bilateral.  She has pain on palpation of the posterior aspect of the right calcaneus at the insertion site of the Achilles.  She also has pain on palpation medial calcaneal tubercle bilateral.  No pain on medial and lateral compression of the calcaneus.  Gait: Unassisted, Nonantalgic.    Radiographs:  Radiographs taken today demonstrate soft tissue increase in density plantar fashion calcaneal insertion sites bilateral with slight thickening of the Achilles tendinous insertion site on the posterior heel  with a very small osseous protuberance.  Assessment & Plan:   Assessment: Plantar fasciitis bilateral.  Insertional Achilles tendinitis possible bursitis right heel.  More than likely compensatory.  Plan: We discussed the etiology pathology conservative versus surgical therapies.  At this point after sterile Betadine skin prep I injected the bilateral heels with 20 mg of Kenalog 5 mg Marcaine point maximal tenderness.  Injected the posterior medial aspect near the Achilles with 2  mg of dexamethasone and local anesthetic.  Placed her in a plantar fascial night splint for the Achilles tendinitis and plantar fascial braces bilateral.  Start her on a Medrol Dosepak to be followed by meloxicam.  Discussed appropriate shoe gear stretching exercises ice therapy and shoe gear modifications.     Esmirna Ravan T. [place], [place]

## 2017-10-19 DIAGNOSIS — H35363 Drusen (degenerative) of macula, bilateral: Secondary | ICD-10-CM | POA: Diagnosis not present

## 2017-10-19 DIAGNOSIS — H52202 Unspecified astigmatism, left eye: Secondary | ICD-10-CM | POA: Diagnosis not present

## 2017-10-19 DIAGNOSIS — H524 Presbyopia: Secondary | ICD-10-CM | POA: Diagnosis not present

## 2017-10-19 DIAGNOSIS — H1789 Other corneal scars and opacities: Secondary | ICD-10-CM | POA: Diagnosis not present

## 2017-10-20 ENCOUNTER — Telehealth: Payer: Self-pay | Admitting: Certified Nurse Midwife

## 2017-10-20 DIAGNOSIS — Z30432 Encounter for removal of intrauterine contraceptive device: Secondary | ICD-10-CM

## 2017-10-20 DIAGNOSIS — Z30431 Encounter for routine checking of intrauterine contraceptive device: Secondary | ICD-10-CM

## 2017-10-20 NOTE — Telephone Encounter (Signed)
She is sexually active, so she could decide on removal and reinsertion for contraception, could do [place], but not guarantee menopausal, AMH helpful. She could come in have labs and then decide. I would be fine with that option.

## 2017-10-20 NOTE — Telephone Encounter (Signed)
Spoke with patient. Calling to schedule IUD removal for October. She states she has not had a period since IUD was placed and no spotting for "a couple of years."  She does not think she needs replacement.  Advised will send provider message and ask if needs labs before/after IUD insertion to ensure menopausal and if needs replacement. Pt agreeable to plan.   Debbi can you review and advise if patient needs to have [place] drawn prior to IUD removal or just can have IUD removal appointment.

## 2017-10-20 NOTE — Telephone Encounter (Signed)
Patient is calling to schedule IUD removal.

## 2017-10-21 NOTE — Telephone Encounter (Signed)
Return call to patient. Left message to call back and ask for triage nurse.  No details left.

## 2017-10-25 NOTE — Telephone Encounter (Signed)
Patient returned call for triage nurse.

## 2017-10-25 NOTE — Telephone Encounter (Signed)
Called patient and left message.  Mychart message sent as well. AMH level ordered if patient would like to make lab appointment.

## 2017-11-04 ENCOUNTER — Other Ambulatory Visit: Payer: BLUE CROSS/BLUE SHIELD

## 2017-11-04 DIAGNOSIS — Z30431 Encounter for routine checking of intrauterine contraceptive device: Secondary | ICD-10-CM

## 2017-11-08 ENCOUNTER — Other Ambulatory Visit: Payer: Self-pay | Admitting: Internal Medicine

## 2017-11-08 DIAGNOSIS — Z1231 Encounter for screening mammogram for malignant neoplasm of breast: Secondary | ICD-10-CM

## 2017-11-09 LAB — ANTI MULLERIAN HORMONE: ANTI-MULLERIAN HORMONE (AMH): <0.015 ng/mL

## 2017-11-10 ENCOUNTER — Ambulatory Visit: Payer: BLUE CROSS/BLUE SHIELD | Admitting: Podiatry

## 2017-11-14 ENCOUNTER — Telehealth: Payer: Self-pay | Admitting: Certified Nurse Midwife

## 2017-11-14 NOTE — Telephone Encounter (Signed)
Patient sent the following correspondence through [facility]. Routing to triage to assist patient with request.  Is there a wait time after IUD removal to do the [facility] test, since there is a small amount of hormones with the IUD? Just want to make sure we get accurate reading. I really just want to make sure I'm not going to have a menstrual cycle after IUD removal.  Thanks!!    ----- Message -----  From: Melvia Heaps, CNM  Sent: 11/11/17 4:41 PM  To: Jaime Rogers  Subject: RE: Visit Follow-Up Question    Hi [name],  Yes we can do [facility] when you come in.  Debbi    ----- Message -----   From: Jaime Rogers   Sent: 11/11/2017 1:18 PM EDT    To: Melvia Heaps, CNM  Subject: Visit Follow-Up Question    I have a question about ANTI MULLERIAN HORMONE resulted on 11/09/17 at 4:36 PM.    I thought we were determining if I was in menopause. Why would we not have done the [facility] level test? I mean, I certainly don't want to get pregnant, but I really wanted to see if I was in menopaise.    Just looking online, the [facility] test was what I was interested in. Can we do this when I come in for IUD removal?

## 2017-11-21 NOTE — Telephone Encounter (Signed)
Debbi responded via mychart:   Hi [name],  There is no way to know if you will have menstrual cycle after removal. FSH is not an absolute of no menstrual cycle, it is a range for menopause. We do not need to wait to draw [facility].  Debbi    Last read by Huntley Dec at 4:00 PM on 11/14/2017.

## 2017-11-21 NOTE — Telephone Encounter (Signed)
Routing to triage for review.

## 2017-11-22 ENCOUNTER — Ambulatory Visit
Admission: RE | Admit: 2017-11-22 | Discharge: 2017-11-22 | Disposition: A | Discharge status: Home / Self Care | Payer: BLUE CROSS/BLUE SHIELD | Source: Ambulatory Visit

## 2017-11-22 DIAGNOSIS — Z1231 Encounter for screening mammogram for malignant neoplasm of breast: Secondary | ICD-10-CM | POA: Diagnosis not present

## 2017-11-25 ENCOUNTER — Encounter: Payer: Self-pay | Admitting: Certified Nurse Midwife

## 2017-11-25 ENCOUNTER — Other Ambulatory Visit: Payer: Self-pay

## 2017-11-25 ENCOUNTER — Ambulatory Visit (INDEPENDENT_AMBULATORY_CARE_PROVIDER_SITE_OTHER): Payer: BLUE CROSS/BLUE SHIELD | Admitting: Certified Nurse Midwife

## 2017-11-25 VITALS — BP 110/78 | HR 70 | Resp 16 | Wt 176.0 lb

## 2017-11-25 DIAGNOSIS — Z30432 Encounter for removal of intrauterine contraceptive device: Secondary | ICD-10-CM | POA: Diagnosis not present

## 2017-11-25 DIAGNOSIS — N951 Menopausal and female climacteric states: Secondary | ICD-10-CM

## 2017-11-25 DIAGNOSIS — N912 Amenorrhea, unspecified: Secondary | ICD-10-CM

## 2017-11-25 NOTE — Patient Instructions (Signed)
Human Papillomavirus Human papillomavirus (HPV) is the most common sexually transmitted infection (STI). It is easy to pass it from person to person (contagious). HPV can cause cervical cancer, anal cancer, and genital warts. The genital warts can be seen and felt. Also, there may be wartlike regions in the throat. HPV may not have any symptoms. It is possible to have HPV for a long time and not know it. You may pass HPV on to others without knowing it. Follow these instructions at home:  Take medicines as told by your doctor.  Use over-the-counter creams for itching as told by your doctor.  Keep all follow-up visits. Make sure to get Pap tests as told by your doctor.  Do not touch or scratch the warts.  Do not treat genital warts with medicines used for treating hand warts.  Do not have sex while you are getting treatment.  Do not douche or use tampons during treatment of HPV.  Tell your sex partner about your infection because he or she may also need treatment.  If you get pregnant, tell your doctor that you had HPV. Your doctor will watch your pregnancy closely. This is important to keep your baby safe.  After treatment, use condoms during sex to prevent future infections.  Have only one sex partner.  Have a sex partner who does not have other sex partners. Contact a doctor if:  The treated skin is red, swollen, or painful.  You have a fever.  You feel ill.  You feel lumps or pimple-like areas in and around your genital area.  You have bleeding of the vagina or the area that was treated.  You have pain during sex. This information is not intended to replace advice given to you by your health care provider. Make sure you discuss any questions you have with your health care provider. Document Released: 01/15/2008 Document Revised: 07/10/2015 Document Reviewed: 05/09/2013 Elsevier Interactive Patient Education  2017 [employer].

## 2017-11-25 NOTE — Progress Notes (Signed)
Review of Systems  Constitutional: Negative.   Gastrointestinal: Negative.        No periods, not sexually active  Genitourinary: Negative.        No periods, no sexually active, menopausal symptoms  Musculoskeletal: Negative.   Skin: Negative.   Neurological: Negative.   Psychiatric/Behavioral: Negative.     52 yrs Caucasian Divorced [id number] LMP unknown, no periods with IUD. Presents for Mirena IUD removal.  Denies any vaginal symptoms or STD concerns. Does have a question about HPV with pap smear.  Plans for contraception are condoms/ Menopausal.           HPI neg.  Exam: Orientation X 3  Affect normal Abdomen: soft non-tender Groin:Inguinal lymph nodes not tender or enlarged    Pelvic exam:Pelvic exam:  VULVA: normal appearing vulva with no masses, tenderness or lesions,  VAGINA: normal appearing vagina with normal color and discharge, no lesions,  CERVIX: normal appearing cervix without discharge or lesions, IUD string noted in cervix,  UTERUS: uterus is normal size, shape, consistency and nontender, anteverted, ADNEXA: normal adnexa in size, nontender and no masses.  Procedure: Speculum placed, cervix visualized.  IUD string visualized, grasp with ring forceps, with gentle traction IUD removed intact.  IUD shown to patient and discarded. Speculum removed.   Assessment:Mirena removal Perimenopausal Pt tolerated procedure well. HPV history of colpo  Plan: Begin contraceptive choice of  condoms Lab: [facility] Aware she may have some bleeding with IUD removal today.  Discussed no vaccine for Adults after age 75. Discussed current sexually active population large percentage have HPV, but not detected. Best protection at this point is condoms and conversation with partner for prevention. Printed information given also. Questions addressed.   Rv aex

## 2017-11-26 LAB — FOLLICLE STIMULATING HORMONE: FSH: 124.5 m[IU]/mL

## 2017-11-29 ENCOUNTER — Ambulatory Visit (INDEPENDENT_AMBULATORY_CARE_PROVIDER_SITE_OTHER): Payer: BLUE CROSS/BLUE SHIELD | Admitting: Podiatry

## 2017-11-29 ENCOUNTER — Encounter: Payer: Self-pay | Admitting: Podiatry

## 2017-11-29 DIAGNOSIS — M722 Plantar fascial fibromatosis: Secondary | ICD-10-CM | POA: Diagnosis not present

## 2017-11-29 DIAGNOSIS — M7661 Achilles tendinitis, right leg: Secondary | ICD-10-CM | POA: Diagnosis not present

## 2017-11-29 NOTE — Progress Notes (Signed)
Presents today for follow-up of her bilateral heels and Achilles tendinitis.  She states that the plantar fasciitis is the same left foot appears to be worse than that of the right the Achilles is doing much better on the right side as well.  I have not been taking the meloxicam regularly nor have a been taking the Aleve on a regular basis.  States that she purchased over-the-counter insoles.  Objective: Vital signs are stable she is alert and oriented x3 pulses remain palpable.  She has warm area to touch of the plantar medial aspect of the left heel none on the right and none on the Achilles on either foot.  She has pain upon direct palpation medial calcaneal tubercles bilateral.  No pain on palpation of the Achilles bilateral.  Assessment: Achilles tendinitis is resolved.  Plantar fasciitis left greater than that of the right.  Plan: Discussed etiology pathology and surgical therapies I want her to go ahead and continue use of the anti-inflammatories I provided her I want her doing more consistently.  I also reinjected bilateral heels 20 mg Kenalog 5 mg Marcaine bilateral heels.  Tolerated procedure well without complications after sterile Betadine skin prep.  Also recommend that she continue the plantar fascial brace and night splints and appropriate shoe gear.

## 2018-01-06 ENCOUNTER — Other Ambulatory Visit: Payer: Self-pay

## 2018-01-06 ENCOUNTER — Ambulatory Visit: Payer: BLUE CROSS/BLUE SHIELD | Admitting: Certified Nurse Midwife

## 2018-01-06 ENCOUNTER — Other Ambulatory Visit (HOSPITAL_COMMUNITY)
Admission: RE | Admit: 2018-01-06 | Discharge: 2018-01-06 | Disposition: A | Discharge status: Home / Self Care | Payer: BLUE CROSS/BLUE SHIELD | Source: Ambulatory Visit | Attending: Certified Nurse Midwife | Admitting: Certified Nurse Midwife

## 2018-01-06 ENCOUNTER — Encounter: Payer: Self-pay | Admitting: Certified Nurse Midwife

## 2018-01-06 VITALS — BP 110/64 | HR 68 | Resp 16 | Ht 64.0 in | Wt 177.0 lb

## 2018-01-06 DIAGNOSIS — Z124 Encounter for screening for malignant neoplasm of cervix: Secondary | ICD-10-CM | POA: Insufficient documentation

## 2018-01-06 DIAGNOSIS — Z8742 Personal history of other diseases of the female genital tract: Secondary | ICD-10-CM

## 2018-01-06 DIAGNOSIS — Z01419 Encounter for gynecological examination (general) (routine) without abnormal findings: Secondary | ICD-10-CM

## 2018-01-06 NOTE — Progress Notes (Signed)
52 y.o. G20P0010 Divorced  Caucasian Fe here for annual exam. No bleeding except for after removal of IUD. Sees Dr. Dagmar Hait for aex and labs and cholesterol management. Continues with Mobic for feet inflammation. Not sexually active. No health issues today. Planning holiday with family!  No LMP recorded. (Menstrual status: Perimenopausal).          Sexually active: No.  The current method of family planning is abstinence.    Exercising: Yes.    walking Smoker:  no  Review of Systems  Constitutional: Negative.   HENT: Negative.   Eyes: Negative.   Respiratory: Negative.   Cardiovascular: Negative.   Gastrointestinal: Negative.   Genitourinary: Negative.   Musculoskeletal: Negative.   Skin: Negative.   Neurological: Negative.   Endo/Heme/Allergies: Negative.   Psychiatric/Behavioral: Negative.     Health Maintenance: Pap:  12-05-14 neg HPV HR neg, 12-16-16 ASCUS HPV HR+ History of Abnormal Pap: yes MMG:  11-22-17 category c density birads 1:neg Self Breast exams: no Colonoscopy:  2009 family hx of colon cancer, f/u 16yrs not done, did cologard negative BMD:   none TDaP:  2011 Shingles: no Pneumonia: no Hep C and HIV: HIV neg 2017 Labs: if needed   reports that she quit smoking about 12 years ago. She has never used smokeless tobacco. She reports that she drinks about 4.0 - 6.0 standard drinks of alcohol per week. She reports that she does not use drugs.  Past Medical History:  Diagnosis Date  . Abnormal Pap smear of cervix    early 90's & 12-16-16 ASCUS HPV HR +  . AC (acromioclavicular) joint bone spurs    neck  . Arthritis    in the neck  . Depression   . Plantar fasciitis   . STD (sexually transmitted disease)    chlamydia in college    Past Surgical History:  Procedure Laterality Date  . APPENDECTOMY  1979  . COLPOSCOPY     early 62's, 2018 CIN1  . INTRAUTERINE DEVICE INSERTION  10/04, 10/09   old mirena out/ insertion of new Mirena IUD, insertion again 12/13/12     Current Outpatient Medications  Medication Sig Dispense Refill  . ALPRAZolam (XANAX) 0.25 MG tablet   1  . Ascorbic Acid (VITAMIN C PO) Take 1,000 mg by mouth.    Marland Kitchen BIOTIN PO Take by mouth.    . fexofenadine (ALLEGRA) 180 MG tablet Take 180 mg by mouth daily.    . meloxicam (MOBIC) 15 MG tablet Take 1 tablet (15 mg total) by mouth daily. 30 tablet 3  . montelukast (SINGULAIR) 10 MG tablet   4  . Multiple Vitamins-Minerals (MULTIVITAMIN PO) Take by mouth daily.    . Naproxen Sodium (ALEVE PO) Take by mouth as needed.     No current facility-administered medications for this visit.     Family History  Problem Relation Age of Onset  . Endometriosis Mother   . Hypertension Father   . Diverticulitis Father   . Leukemia Maternal Grandmother   . Cancer Paternal Grandfather        colon cancer  . Breast cancer Neg Hx     ROS:  Pertinent items are noted in HPI.  Otherwise, a comprehensive ROS was negative.  Exam:   BP 110/64   Pulse 68   Resp 16   Ht 5\' 4"  (1.626 m)   Wt 177 lb (80.3 kg)   BMI 30.38 kg/m  Height: 5\' 4"  (162.6 cm) Ht Readings from Last 3 Encounters:  01/06/18 5\' 4"  (1.626 m)  01/11/17 5' 4.25" (1.632 m)  12/16/16 5' 4.25" (1.632 m)    General appearance: alert, cooperative and appears stated age Head: Normocephalic, without obvious abnormality, atraumatic Neck: no adenopathy, supple, symmetrical, trachea midline and thyroid normal to inspection and palpation Lungs: clear to auscultation bilaterally Breasts: normal appearance, no masses or tenderness, No nipple retraction or dimpling, No nipple discharge or bleeding, No axillary or supraclavicular adenopathy Heart: regular rate and rhythm Abdomen: soft, non-tender; no masses,  no organomegaly Extremities: extremities normal, atraumatic, no cyanosis or edema Skin: Skin color, texture, turgor normal. No rashes or lesions Lymph nodes: Cervical, supraclavicular, and axillary nodes normal. No abnormal  inguinal nodes palpated Neurologic: Grossly normal   Pelvic: External genitalia:  no lesions              Urethra:  normal appearing urethra with no masses, tenderness or lesions              Bartholin's and Skene's: normal                 Vagina: normal appearing vagina with normal color and discharge, no lesions              Cervix: no cervical motion tenderness and no lesions              Pap taken: Yes.   Bimanual Exam:  Uterus:  normal size, contour, position, consistency, mobility, non-tender and anteverted              Adnexa: normal adnexa and no mass, fullness, tenderness               Rectovaginal: Confirms               Anus:  normal sphincter tone, no lesions  Chaperone present: yes  A:  Well Woman with normal exam  Perimenopausal not symptomatic, spotting after IUD removal only  History of ASCUS + HPV pap with colpo showing LSIL, CIN 1 follow up pap today  Medication management for allergies with PCP    P:   Reviewed health and wellness pertinent to exam  Aware of need to advise if vaginal bleeding now with IUD removed, may occur. Aware of expectations with menopause symptoms.  Continue with follow up with PCP as indicated.  Pap smear: yes, repeat in one year if negative if not per results   counseled on breast self exam, mammography screening, STD prevention, HIV risk factors and prevention, adequate intake of calcium and vitamin D, diet and exercise  return annually or prn  An After Visit Summary was printed and given to the patient.

## 2018-01-06 NOTE — Addendum Note (Signed)
Addended by: Regina Eck on: 01/06/2018 01:06 PM   Modules accepted: Orders

## 2018-01-10 ENCOUNTER — Ambulatory Visit: Payer: BLUE CROSS/BLUE SHIELD | Admitting: Podiatry

## 2018-01-10 ENCOUNTER — Other Ambulatory Visit: Payer: Self-pay | Admitting: Certified Nurse Midwife

## 2018-01-10 DIAGNOSIS — R87618 Other abnormal cytological findings on specimens from cervix uteri: Secondary | ICD-10-CM

## 2018-01-10 DIAGNOSIS — R8789 Other abnormal findings in specimens from female genital organs: Secondary | ICD-10-CM

## 2018-01-10 LAB — CYTOLOGY - PAP
Diagnosis: NEGATIVE
HPV: DETECTED — AB

## 2018-01-16 ENCOUNTER — Telehealth: Payer: Self-pay | Admitting: Emergency Medicine

## 2018-01-16 NOTE — Telephone Encounter (Signed)
-----   Message from Regina Eck, CNM sent at 01/10/2018  9:27 PM EST ----- Pap smear negative but HPV detected and due to this being follow up will need colposcopy again Please schedule  Order placed

## 2018-01-26 ENCOUNTER — Encounter: Payer: Self-pay | Admitting: Certified Nurse Midwife

## 2018-01-26 ENCOUNTER — Other Ambulatory Visit: Payer: Self-pay

## 2018-01-26 ENCOUNTER — Ambulatory Visit (INDEPENDENT_AMBULATORY_CARE_PROVIDER_SITE_OTHER): Payer: BLUE CROSS/BLUE SHIELD | Admitting: Certified Nurse Midwife

## 2018-01-26 VITALS — BP 120/70 | HR 70 | Resp 16 | Wt 184.0 lb

## 2018-01-26 DIAGNOSIS — Z01812 Encounter for preprocedural laboratory examination: Secondary | ICD-10-CM | POA: Diagnosis not present

## 2018-01-26 DIAGNOSIS — R8789 Other abnormal findings in specimens from female genital organs: Secondary | ICD-10-CM

## 2018-01-26 DIAGNOSIS — Z8742 Personal history of other diseases of the female genital tract: Secondary | ICD-10-CM

## 2018-01-26 DIAGNOSIS — N72 Inflammatory disease of cervix uteri: Secondary | ICD-10-CM | POA: Diagnosis not present

## 2018-01-26 DIAGNOSIS — R87618 Other abnormal cytological findings on specimens from cervix uteri: Secondary | ICD-10-CM

## 2018-01-26 LAB — POCT URINE PREGNANCY: Preg Test, Ur: NEGATIVE

## 2018-01-26 NOTE — Patient Instructions (Signed)

## 2018-01-26 NOTE — Progress Notes (Signed)
01-06-18 neg HPV HR + Hx of LGSIL Pt took meloxicam UPT-neg today

## 2018-01-26 NOTE — Progress Notes (Addendum)
Patient ID: Jaime Rogers, female   DOB: 12/28/1965, 52 y.o.   MRN: [id number]  Chief Complaint  Patient presents with  . Colposcopy    //jj    HPI Jaime Rogers is a 52 y.o. white divorced g1 p0010 menopausal female here for colposcopy due to + HPV. Denies pelvic pain or bleeding or other health concerns today.   Indications: Pap smear on 01/10/18 showed: + HPV. Previous colposcopy: HPV related changes, CIN 1 and in 01/11/17 showed ASCUS + HPV. Prior cervical treatment: colpsoscopy showed LSIL, CIN1 with koilocytic atypia consistent with HPVHR effect.  Past Medical History:  Diagnosis Date  . Abnormal Pap smear of cervix    early 90's & 12-16-16 ASCUS HPV HR +, 01-06-18 neg HPV HR+  . AC (acromioclavicular) joint bone spurs    neck  . Arthritis    in the neck  . Depression   . Plantar fasciitis   . Shingles    in early 64s  . STD (sexually transmitted disease)    chlamydia in college    Past Surgical History:  Procedure Laterality Date  . APPENDECTOMY  1979  . COLPOSCOPY     early 3's, 2018 CIN1  . INTRAUTERINE DEVICE INSERTION  10/04, 10/09   old mirena out/ insertion of new Mirena IUD, insertion again 12/13/12    Family History  Problem Relation Age of Onset  . Endometriosis Mother   . Hypertension Father   . Diverticulitis Father   . Leukemia Maternal Grandmother   . Cancer Paternal Grandfather        colon cancer  . Breast cancer Neg Hx     Social History Social History   Tobacco Use  . Smoking status: Former Smoker    Last attempt to quit: 08/28/2005    Years since quitting: 12.4  . Smokeless tobacco: Never Used  Substance Use Topics  . Alcohol use: Yes    Alcohol/week: 4.0 - 6.0 standard drinks    Types: 4 - 6 Standard drinks or equivalent per week  . Drug use: No    Allergies  Allergen Reactions  . Minocycline Hives    Current Outpatient Medications  Medication Sig Dispense Refill  . ALPRAZolam (XANAX) 0.25 MG tablet   1  . amoxicillin  (AMOXIL) 875 MG tablet Take 875 mg by mouth 2 (two) times daily.    . Ascorbic Acid (VITAMIN C PO) Take 1,000 mg by mouth.    Marland Kitchen BIOTIN PO Take by mouth.    . fexofenadine (ALLEGRA) 180 MG tablet Take 180 mg by mouth daily.    . meloxicam (MOBIC) 15 MG tablet Take 1 tablet (15 mg total) by mouth daily. 30 tablet 3  . montelukast (SINGULAIR) 10 MG tablet   4  . Multiple Vitamins-Minerals (MULTIVITAMIN PO) Take by mouth daily.    . Naproxen Sodium (ALEVE PO) Take by mouth as needed.     No current facility-administered medications for this visit.     Review of Systems Review of Systems  Constitutional: Negative.   HENT: Negative.   Eyes: Negative.   Respiratory: Negative.   Cardiovascular: Negative.   Gastrointestinal: Negative.   Endocrine: Negative.   Genitourinary: Negative.  Negative for pelvic pain, vaginal discharge and vaginal pain.  Allergic/Immunologic: Negative.   Neurological: Negative.   Hematological: Negative.   Psychiatric/Behavioral: Negative.     Blood pressure 120/70, pulse 70, resp. rate 16, weight 184 lb (83.5 kg).  Physical Exam Physical Exam Exam conducted with a chaperone  present.  Constitutional:      Appearance: Normal appearance.  Cardiovascular:     Rate and Rhythm: Normal rate.  Pulmonary:     Effort: Pulmonary effort is normal.  Genitourinary:    General: Normal vulva.     Labia:        Right: No rash, tenderness or lesion.        Left: No rash, tenderness or lesion.      Vagina: Normal.     Cervix: Normal.     Lymphadenopathy:     Lower Body: No right inguinal adenopathy. No left inguinal adenopathy.  Skin:    General: Skin is warm and dry.  Neurological:     Mental Status: She is alert and oriented to person, place, and time.  Psychiatric:        Mood and Affect: Mood normal.        Behavior: Behavior normal.        Thought Content: Thought content normal.        Judgment: Judgment normal.     Data Reviewed Reviewed pap smear  finding and questions addressed.  Assessment   History of ASCUS pap with + HPV 2018 and + HPV on recent 2019 pap. Satisfactory coloposcopy  Procedure Details  The risks and benefits of the procedure and Written informed consent obtained.  Speculum placed in vagina and excellent visualization of cervix achieved, cervix swabbed x 3 with saline and  acetic acid solution. Acetowhite areas noted at 5,2, 11 o'clock after viewing with 3.75, 7.5 15 # and green filter. Lugol's applied with non staining noted in same areas. Biopsies were taken at 5 o'clock, 2 o'clock and 11 o'clock, ECC taken. HPV effect noted on cervix. Monsel's applied with no bleeding noted on removal of speculum. Patient tolerated procedure well. Instructions given. Patient in stable condition when escorted to check out desk. Folic acid information also given.  Specimens: 4  Complications: none.     Plan    Specimens labelled and sent to Pathology. Patient will be called with results once reviewed and recommendations made. Pathology reviewed all biopsies and ECC showed benign squamous epithelium. Biopsy at 5 o'clock showed localized inflammation also. No malignancy or dysplasia or atypia noted on any of the biopsies or ECC.  Patient to be notified of results and placed in pap recall 08.   Melvia Heaps 01/26/2018, 2:43 PM

## 2018-01-27 ENCOUNTER — Encounter: Payer: Self-pay | Admitting: Certified Nurse Midwife

## 2018-03-23 DIAGNOSIS — R82998 Other abnormal findings in urine: Secondary | ICD-10-CM | POA: Diagnosis not present

## 2018-03-23 DIAGNOSIS — Z0000 Encounter for general adult medical examination without abnormal findings: Secondary | ICD-10-CM | POA: Diagnosis not present

## 2018-03-23 DIAGNOSIS — E7849 Other hyperlipidemia: Secondary | ICD-10-CM | POA: Diagnosis not present

## 2018-03-30 DIAGNOSIS — Z780 Asymptomatic menopausal state: Secondary | ICD-10-CM | POA: Diagnosis not present

## 2018-03-30 DIAGNOSIS — E7849 Other hyperlipidemia: Secondary | ICD-10-CM | POA: Diagnosis not present

## 2018-03-30 DIAGNOSIS — Z23 Encounter for immunization: Secondary | ICD-10-CM | POA: Diagnosis not present

## 2018-03-30 DIAGNOSIS — J302 Other seasonal allergic rhinitis: Secondary | ICD-10-CM | POA: Diagnosis not present

## 2018-03-30 DIAGNOSIS — Z0000 Encounter for general adult medical examination without abnormal findings: Secondary | ICD-10-CM | POA: Diagnosis not present

## 2018-03-30 DIAGNOSIS — Z1331 Encounter for screening for depression: Secondary | ICD-10-CM | POA: Diagnosis not present

## 2018-03-30 DIAGNOSIS — F419 Anxiety disorder, unspecified: Secondary | ICD-10-CM | POA: Diagnosis not present

## 2018-04-14 DIAGNOSIS — Z13820 Encounter for screening for osteoporosis: Secondary | ICD-10-CM | POA: Diagnosis not present

## 2018-07-03 DIAGNOSIS — Z03818 Encounter for observation for suspected exposure to other biological agents ruled out: Secondary | ICD-10-CM | POA: Diagnosis not present

## 2018-11-06 DIAGNOSIS — Z23 Encounter for immunization: Secondary | ICD-10-CM | POA: Diagnosis not present

## 2018-11-17 ENCOUNTER — Encounter (HOSPITAL_BASED_OUTPATIENT_CLINIC_OR_DEPARTMENT_OTHER): Payer: Self-pay | Admitting: *Deleted

## 2018-11-17 ENCOUNTER — Emergency Department (HOSPITAL_BASED_OUTPATIENT_CLINIC_OR_DEPARTMENT_OTHER)
Admission: EM | Admit: 2018-11-17 | Discharge: 2018-11-18 | Disposition: A | Discharge status: Home / Self Care | Payer: BC Managed Care – PPO | Attending: Emergency Medicine | Admitting: Emergency Medicine

## 2018-11-17 ENCOUNTER — Other Ambulatory Visit: Payer: Self-pay

## 2018-11-17 DIAGNOSIS — Z87891 Personal history of nicotine dependence: Secondary | ICD-10-CM | POA: Diagnosis not present

## 2018-11-17 DIAGNOSIS — Y999 Unspecified external cause status: Secondary | ICD-10-CM | POA: Insufficient documentation

## 2018-11-17 DIAGNOSIS — S61217A Laceration without foreign body of left little finger without damage to nail, initial encounter: Secondary | ICD-10-CM

## 2018-11-17 DIAGNOSIS — Y929 Unspecified place or not applicable: Secondary | ICD-10-CM | POA: Insufficient documentation

## 2018-11-17 DIAGNOSIS — M20019 Mallet finger of unspecified finger(s): Secondary | ICD-10-CM

## 2018-11-17 DIAGNOSIS — Y9389 Activity, other specified: Secondary | ICD-10-CM | POA: Insufficient documentation

## 2018-11-17 DIAGNOSIS — S61317A Laceration without foreign body of left little finger with damage to nail, initial encounter: Secondary | ICD-10-CM | POA: Insufficient documentation

## 2018-11-17 DIAGNOSIS — Z7982 Long term (current) use of aspirin: Secondary | ICD-10-CM | POA: Insufficient documentation

## 2018-11-17 DIAGNOSIS — W272XXA Contact with scissors, initial encounter: Secondary | ICD-10-CM | POA: Insufficient documentation

## 2018-11-17 DIAGNOSIS — M20012 Mallet finger of left finger(s): Secondary | ICD-10-CM | POA: Insufficient documentation

## 2018-11-17 MED ORDER — LIDOCAINE-EPINEPHRINE 1 %-1:100000 IJ SOLN
INTRAMUSCULAR | Status: AC
  Administered 2018-11-18
  Filled 2018-11-17: qty 1

## 2018-11-17 NOTE — ED Provider Notes (Signed)
[facility] Provider Note: [name], MD, FACEP  CSN: [id number] MRN: [id number] ARRIVAL: 11/17/18 at [place]: [place]  Laceration   HISTORY OF PRESENT ILLNESS  11/17/18 11:44 PM Jaime Rogers is a 53 y.o. female who cut her left fifth finger when some scissors slipped about 6:30 PM this evening.  There is a laceration overlying the dorsal DIP joint of the fifth finger.  She is unable to extend the finger at that joint.  The finger is held in a bent position at the DIP joint.  Sensation remains intact.  Her tetanus is up-to-date.   Past Medical History:  Diagnosis Date  . Abnormal Pap smear of cervix    early 90's & 12-16-16 ASCUS HPV HR +, 01-06-18 neg HPV HR+  . AC (acromioclavicular) joint bone spurs    neck  . Arthritis    in the neck  . Depression   . Plantar fasciitis   . Shingles    in early 39s  . STD (sexually transmitted disease)    chlamydia in college    Past Surgical History:  Procedure Laterality Date  . APPENDECTOMY  1979  . COLPOSCOPY     early 48's, 2018 CIN1  . INTRAUTERINE DEVICE INSERTION  10/04, 10/09   old mirena out/ insertion of new Mirena IUD, insertion again 12/13/12    Family History  Problem Relation Age of Onset  . Endometriosis Mother   . Hypertension Father   . Diverticulitis Father   . Leukemia Maternal Grandmother   . Cancer Paternal Grandfather        colon cancer  . Breast cancer Neg Hx     Social History   Tobacco Use  . Smoking status: Former Smoker    Quit date: 08/28/2005    Years since quitting: 13.2  . Smokeless tobacco: Never Used  Substance Use Topics  . Alcohol use: Yes    Alcohol/week: 4.0 - 6.0 standard drinks    Types: 4 - 6 Standard drinks or equivalent per week  . Drug use: No    Prior to Admission medications   Medication Sig Start Date End Date Taking? Authorizing Provider  ALPRAZolam Duanne Moron) 0.25 MG tablet  09/05/17  Yes [provider]  Ascorbic  Acid (VITAMIN C PO) Take 1,000 mg by mouth.   Yes [provider]  BIOTIN PO Take by mouth.   Yes [provider]  fexofenadine (ALLEGRA) 180 MG tablet Take 180 mg by mouth daily.   Yes [provider]  montelukast (SINGULAIR) 10 MG tablet  11/29/16  Yes [provider]  Multiple Vitamins-Minerals (MULTIVITAMIN PO) Take by mouth daily.   Yes [provider]  amoxicillin (AMOXIL) 875 MG tablet Take 875 mg by mouth 2 (two) times daily.    [provider]  meloxicam (MOBIC) 15 MG tablet Take 1 tablet (15 mg total) by mouth daily. 10/13/17   Hyatt, Max T, DPM  Naproxen Sodium (ALEVE PO) Take by mouth as needed.    [provider]    Allergies Minocycline   REVIEW OF SYSTEMS  Negative except as noted here or in the History of Present Illness.   PHYSICAL EXAMINATION  Initial Vital Signs Blood pressure (!) 142/82, pulse (!) 56, temperature (!) 97.5 F (36.4 C), temperature source Oral, resp. rate 18, SpO2 100 %.  Examination General: Well-developed, well-nourished female in no acute distress; appearance consistent with age of record HENT: normocephalic; atraumatic Eyes: Normal appearance Neck:  supple Heart: regular rate and rhythm Lungs: clear to auscultation bilaterally Abdomen: soft; nondistended; nontender; bowel sounds present Extremities: Laceration of dorsal aspect of left fifth finger DIP; left fifth finger DIP held in flexion with inability to extend Neurologic: Awake, alert and oriented; motor function intact in all extremities and symmetric; no facial droop Skin: Warm and dry Psychiatric: Normal mood and affect   RESULTS  Summary of this visit's results, reviewed by myself:   EKG Interpretation  Date/Time:    Ventricular Rate:    PR Interval:    QRS Duration:   QT Interval:    QTC Calculation:   R Axis:     Text Interpretation:        Laboratory Studies: No results found for this or any previous  visit (from the past 24 hour(s)). Imaging Studies: No results found.  ED COURSE and MDM  Nursing notes and initial vitals signs, including pulse oximetry, reviewed.  Vitals:   11/17/18 2302  BP: (!) 142/82  Pulse: (!) 56  Resp: 18  Temp: (!) 97.5 F (36.4 C)  TempSrc: Oral  SpO2: 100%   Wound closed and finger placed in splint.  There is an apparent extensor tendon injury resulting in a mallet finger.  She will need hand surgery follow-up.  PROCEDURES   LACERATION REPAIR Performed by: Karen Chafe Gaither Biehn Authorized by: Karen Chafe Rayhaan Huster Consent: Verbal consent obtained. Risks and benefits: risks, benefits and alternatives were discussed Consent given by: patient Patient identity confirmed: provided demographic data Prepped and Draped in normal sterile fashion Wound explored  Laceration Location: Left fifth finger  Laceration Length: 1 cm  No Foreign Bodies seen or palpated  Anesthesia: local infiltration  Local anesthetic: lidocaine 2% with epinephrine  Anesthetic total: 0.5 ml  Irrigation method: syringe Amount of cleaning: standard  Skin closure: 5-0 Prolene  Number of sutures: 3  Technique: Simple interrupted  Patient tolerance: Patient tolerated the procedure well with no immediate complications.   ED DIAGNOSES     ICD-10-CM   1. Laceration of left little finger with tendon involvement  S61.217A   2. Mallet deformity of little finger  M20.019        [name], Jenny Reichmann, MD 11/18/18 [phone]

## 2018-11-17 NOTE — ED Triage Notes (Signed)
At approx. 1830 Pt. Was cutting with a scissors and cut into the L pinky finger 1st joint.  Pt. Is unable to straighten the finger now and the  Finger looks to be in a bent position with a laceration the has controlled bleeding.

## 2018-11-18 MED ORDER — CEPHALEXIN 500 MG PO CAPS: 500.0000 mg | ORAL_CAPSULE | Freq: Four times a day (QID) | ORAL | 0 refills | Status: DC

## 2018-11-18 MED ORDER — CEPHALEXIN 250 MG PO CAPS
500.0000 mg | ORAL_CAPSULE | Freq: Once | ORAL | Status: AC
  Administered 2018-11-18: 500 mg via ORAL
  Filled 2018-11-18: qty 2

## 2018-11-20 DIAGNOSIS — M79645 Pain in left finger(s): Secondary | ICD-10-CM | POA: Diagnosis not present

## 2018-11-20 DIAGNOSIS — S61217A Laceration without foreign body of left little finger without damage to nail, initial encounter: Secondary | ICD-10-CM | POA: Diagnosis not present

## 2018-11-20 DIAGNOSIS — S66529A Laceration of intrinsic muscle, fascia and tendon of unspecified finger at wrist and hand level, initial encounter: Secondary | ICD-10-CM | POA: Insufficient documentation

## 2018-11-21 DIAGNOSIS — Y999 Unspecified external cause status: Secondary | ICD-10-CM | POA: Diagnosis not present

## 2018-11-21 DIAGNOSIS — S62637B Displaced fracture of distal phalanx of left little finger, initial encounter for open fracture: Secondary | ICD-10-CM | POA: Diagnosis not present

## 2018-11-21 DIAGNOSIS — S66317A Strain of extensor muscle, fascia and tendon of left little finger at wrist and hand level, initial encounter: Secondary | ICD-10-CM | POA: Diagnosis not present

## 2018-11-21 DIAGNOSIS — S63297A Dislocation of distal interphalangeal joint of left little finger, initial encounter: Secondary | ICD-10-CM | POA: Diagnosis not present

## 2018-11-21 DIAGNOSIS — X58XXXA Exposure to other specified factors, initial encounter: Secondary | ICD-10-CM | POA: Diagnosis not present

## 2018-11-21 DIAGNOSIS — S66327A Laceration of extensor muscle, fascia and tendon of left little finger at wrist and hand level, initial encounter: Secondary | ICD-10-CM | POA: Diagnosis not present

## 2018-12-06 DIAGNOSIS — M79645 Pain in left finger(s): Secondary | ICD-10-CM | POA: Diagnosis not present

## 2018-12-06 DIAGNOSIS — S61217D Laceration without foreign body of left little finger without damage to nail, subsequent encounter: Secondary | ICD-10-CM | POA: Diagnosis not present

## 2018-12-27 ENCOUNTER — Other Ambulatory Visit: Payer: Self-pay

## 2018-12-27 ENCOUNTER — Other Ambulatory Visit: Payer: Self-pay | Admitting: Internal Medicine

## 2018-12-27 ENCOUNTER — Ambulatory Visit
Admission: RE | Admit: 2018-12-27 | Discharge: 2018-12-27 | Disposition: A | Discharge status: Home / Self Care | Payer: BC Managed Care – PPO | Source: Ambulatory Visit

## 2018-12-27 DIAGNOSIS — Z1231 Encounter for screening mammogram for malignant neoplasm of breast: Secondary | ICD-10-CM | POA: Diagnosis not present

## 2019-01-03 DIAGNOSIS — S61217D Laceration without foreign body of left little finger without damage to nail, subsequent encounter: Secondary | ICD-10-CM | POA: Diagnosis not present

## 2019-01-15 ENCOUNTER — Other Ambulatory Visit: Payer: Self-pay

## 2019-01-15 NOTE — Progress Notes (Signed)
53 y.o. G62P0010 Divorced  Caucasian Fe here for annual exam. Menopausal no vaginal bleeding or vaginal dryness. Occasional hot flash and night sweats, using Estroven for the past month, with good results.. Weight loss of 14 pounds and has decreased alcohol use.She is continuing to work on weight loss. Had injury to right 5th finger and surgery. Healing well. Sees Dr. Dagmar Hait once yearly with labs and singular management.Has had one date now! Would like to consider Gardasil vaccine. Aware she is outside the recommended age of 74. Hopeful for normal pap smear today. No other health issues today.  Patient's last menstrual period was 11/12/2013.          Sexually active: No.  The current method of family planning is post menopausal status.    Exercising: Yes.    walking Smoker:  no  Review of Systems  Constitutional: Negative.   HENT: Negative.   Eyes: Negative.   Respiratory: Negative.   Cardiovascular: Negative.   Gastrointestinal: Negative.   Genitourinary: Negative.   Musculoskeletal: Negative.   Skin: Negative.   Neurological: Negative.   Endo/Heme/Allergies: Negative.   Psychiatric/Behavioral: Negative.     Health Maintenance: Pap:  12-16-16 ASCUS HPV HR+ , 01-06-18 neg HPV HR+ History of Abnormal Pap: yes MMG:  12-27-2018 category c density birads 1:neg Self Breast exams: no Colonoscopy:  2009 family hx of colon cancer, f/u 76yrs not done, cologard was negative 2019 per patient BMD:   2020 osteopenia TDaP:  2011, may have update this year with pcp. Pt to check Shingles: not done Pneumonia: not done Hep C and HIV: HIV neg 2017 Labs: if needed   reports that she quit smoking about 13 years ago. She has never used smokeless tobacco. She reports current alcohol use of about 4.0 - 6.0 standard drinks of alcohol per week. She reports that she does not use drugs.  Past Medical History:  Diagnosis Date  . Abnormal Pap smear of cervix    early 90's & 12-16-16 ASCUS HPV HR +, 01-06-18 neg  HPV HR+  . AC (acromioclavicular) joint bone spurs    neck  . Arthritis    in the neck  . Depression   . Plantar fasciitis   . Shingles    in early 94s  . STD (sexually transmitted disease)    chlamydia in college    Past Surgical History:  Procedure Laterality Date  . APPENDECTOMY  1979  . COLPOSCOPY     early 49's, 2018 CIN1  . INTRAUTERINE DEVICE INSERTION  10/04, 10/09   old mirena out/ insertion of new Mirena IUD, insertion again 12/13/12    Current Outpatient Medications  Medication Sig Dispense Refill  . ALPRAZolam (XANAX) 0.25 MG tablet   1  . Ascorbic Acid (VITAMIN C PO) Take 1,000 mg by mouth.    Marland Kitchen BIOTIN PO Take by mouth.    . Calcium Carbonate Antacid (TUMS PO) Take by mouth.    . cephALEXin (KEFLEX) 500 MG capsule Take 1 capsule (500 mg total) by mouth 4 (four) times daily. 20 capsule 0  . Cholecalciferol (D3-1000) 25 MCG (1000 UT) capsule     . fexofenadine (ALLEGRA) 180 MG tablet Take 180 mg by mouth daily.    . montelukast (SINGULAIR) 10 MG tablet   4  . Multiple Vitamins-Minerals (MULTIVITAMIN PO) Take by mouth daily.    . Naproxen Sodium (ALEVE PO) Take by mouth as needed.    . Nutritional Supplements (ESTROVEN PO) Take by mouth.    Marland Kitchen  Turmeric (QC TUMERIC COMPLEX) 500 MG CAPS      No current facility-administered medications for this visit.     Family History  Problem Relation Age of Onset  . Endometriosis Mother   . Hypertension Father   . Diverticulitis Father   . Leukemia Maternal Grandmother   . Cancer Paternal Grandfather        colon cancer  . Breast cancer Neg Hx     ROS:  Pertinent items are noted in HPI.  Otherwise, a comprehensive ROS was negative.  Exam:   BP 110/64   Pulse 68   Temp (!) 97.1 F (36.2 C) (Skin)   Resp 16   Ht 5' 4.25" (1.632 m)   Wt 170 lb (77.1 kg)   LMP 11/12/2013 Comment: spotting  BMI 28.95 kg/m  Height: 5' 4.25" (163.2 cm) Ht Readings from Last 3 Encounters:  01/16/19 5' 4.25" (1.632 m)  01/06/18 5'  4" (1.626 m)  01/11/17 5' 4.25" (1.632 m)    General appearance: alert, cooperative and appears stated age Head: Normocephalic, without obvious abnormality, atraumatic Neck: no adenopathy, supple, symmetrical, trachea midline and thyroid normal to inspection and palpation Lungs: clear to auscultation bilaterally Breasts: normal appearance, no masses or tenderness, No nipple retraction or dimpling, No nipple discharge or bleeding, No axillary or supraclavicular adenopathy, Normal to palpation without dominant masses Heart: regular rate and rhythm Abdomen: soft, non-tender; no masses,  no organomegaly Extremities: extremities normal, atraumatic, no cyanosis or edema Skin: Skin color, texture, turgor normal. No rashes or lesions Lymph nodes: Cervical, supraclavicular, and axillary nodes normal. No abnormal inguinal nodes palpated Neurologic: Grossly normal   Pelvic: External genitalia:  no lesions              Urethra:  normal appearing urethra with no masses, tenderness or lesions              Bartholin's and Skene's: normal                 Vagina: normal appearing vagina with normal color and discharge, no lesions              Cervix: no cervical motion tenderness, no lesions and normal appearance              Pap taken: Yes.   Bimanual Exam:  Uterus:  normal size, contour, position, consistency, mobility, non-tender and anteverted              Adnexa: normal adnexa and no mass, fullness, tenderness               Rectovaginal: Confirms               Anus:  normal sphincter tone, no lesions  Chaperone present: yes  A:  Well Woman with normal exam  Menopausal no HRT, using Estroven OTC with good results with vasomotor symptoms  Fifth finger accident with surgical repair, healing, still under follow up  History of abnormal pap smear of positive HPV in 2019 and ASCUS +HPV in 2018, follow up pap today  Interested in Gardasil vaccine aware she is out of recommended age limit with  use  P:   Reviewed health and wellness pertinent to exam  Aware of need to advise if vaginal bleeding  Discussed following OTC instructions for Estroven.  Continue follow up with provider as discussed.  Will await results and make recommendations for follow up.  Discussed would be used outside of recommendation guideline, but will check to  see if possible and advise.  Pap smear: yes   counseled on breast self exam, mammography screening, STD prevention, HIV risk factors and prevention, feminine hygiene, adequate intake of calcium and vitamin D, diet and exercise  return annually or prn  An After Visit Summary was printed and given to the patient.

## 2019-01-16 ENCOUNTER — Other Ambulatory Visit: Payer: Self-pay

## 2019-01-16 ENCOUNTER — Encounter: Payer: Self-pay | Admitting: Certified Nurse Midwife

## 2019-01-16 ENCOUNTER — Ambulatory Visit: Payer: BC Managed Care – PPO | Admitting: Certified Nurse Midwife

## 2019-01-16 ENCOUNTER — Other Ambulatory Visit (HOSPITAL_COMMUNITY)
Admission: RE | Admit: 2019-01-16 | Discharge: 2019-01-16 | Disposition: A | Discharge status: Home / Self Care | Payer: BC Managed Care – PPO | Source: Ambulatory Visit | Attending: Certified Nurse Midwife | Admitting: Certified Nurse Midwife

## 2019-01-16 VITALS — BP 110/64 | HR 68 | Temp 97.1°F | Resp 16 | Ht 64.25 in | Wt 170.0 lb

## 2019-01-16 DIAGNOSIS — Z124 Encounter for screening for malignant neoplasm of cervix: Secondary | ICD-10-CM

## 2019-01-16 DIAGNOSIS — Z8742 Personal history of other diseases of the female genital tract: Secondary | ICD-10-CM

## 2019-01-16 DIAGNOSIS — Z01419 Encounter for gynecological examination (general) (routine) without abnormal findings: Secondary | ICD-10-CM | POA: Diagnosis not present

## 2019-01-16 DIAGNOSIS — N951 Menopausal and female climacteric states: Secondary | ICD-10-CM

## 2019-01-16 NOTE — Patient Instructions (Signed)

## 2019-01-17 DIAGNOSIS — M79645 Pain in left finger(s): Secondary | ICD-10-CM | POA: Diagnosis not present

## 2019-01-17 DIAGNOSIS — Z4789 Encounter for other orthopedic aftercare: Secondary | ICD-10-CM | POA: Diagnosis not present

## 2019-01-18 LAB — CYTOLOGY - PAP
Comment: NEGATIVE
Diagnosis: NEGATIVE
Diagnosis: REACTIVE
High risk HPV: NEGATIVE

## 2019-01-19 ENCOUNTER — Telehealth: Payer: Self-pay

## 2019-01-19 DIAGNOSIS — M79645 Pain in left finger(s): Secondary | ICD-10-CM | POA: Diagnosis not present

## 2019-01-19 NOTE — Telephone Encounter (Signed)
Left message for call back.

## 2019-01-19 NOTE — Telephone Encounter (Signed)
Patient notified of results as written by provider 

## 2019-01-19 NOTE — Telephone Encounter (Signed)
-----   Message from Regina Eck, CNM sent at 01/18/2019  3:31 PM EST ----- Notify patient that her pap smear was negative and HPV was negative follow up 36 per [facility] Let her know about the Gardasil immunization information

## 2019-01-19 NOTE — Telephone Encounter (Signed)
Patient is returning call to Joy. °

## 2019-02-16 HISTORY — PX: TRIGGER FINGER RELEASE: SHX641

## 2019-03-07 DIAGNOSIS — L814 Other melanin hyperpigmentation: Secondary | ICD-10-CM | POA: Diagnosis not present

## 2019-03-07 DIAGNOSIS — L821 Other seborrheic keratosis: Secondary | ICD-10-CM | POA: Diagnosis not present

## 2019-03-07 DIAGNOSIS — L578 Other skin changes due to chronic exposure to nonionizing radiation: Secondary | ICD-10-CM | POA: Diagnosis not present

## 2019-03-07 DIAGNOSIS — D1801 Hemangioma of skin and subcutaneous tissue: Secondary | ICD-10-CM | POA: Diagnosis not present

## 2019-03-28 DIAGNOSIS — N390 Urinary tract infection, site not specified: Secondary | ICD-10-CM | POA: Diagnosis not present

## 2019-03-28 DIAGNOSIS — Z0000 Encounter for general adult medical examination without abnormal findings: Secondary | ICD-10-CM | POA: Diagnosis not present

## 2019-03-28 DIAGNOSIS — E7849 Other hyperlipidemia: Secondary | ICD-10-CM | POA: Diagnosis not present

## 2019-03-30 DIAGNOSIS — R82998 Other abnormal findings in urine: Secondary | ICD-10-CM | POA: Diagnosis not present

## 2019-04-04 DIAGNOSIS — Z1212 Encounter for screening for malignant neoplasm of rectum: Secondary | ICD-10-CM | POA: Diagnosis not present

## 2019-04-05 DIAGNOSIS — F419 Anxiety disorder, unspecified: Secondary | ICD-10-CM | POA: Diagnosis not present

## 2019-04-05 DIAGNOSIS — Z1331 Encounter for screening for depression: Secondary | ICD-10-CM | POA: Diagnosis not present

## 2019-04-05 DIAGNOSIS — Z0000 Encounter for general adult medical examination without abnormal findings: Secondary | ICD-10-CM | POA: Diagnosis not present

## 2019-04-05 DIAGNOSIS — E785 Hyperlipidemia, unspecified: Secondary | ICD-10-CM | POA: Diagnosis not present

## 2019-04-05 DIAGNOSIS — M8580 Other specified disorders of bone density and structure, unspecified site: Secondary | ICD-10-CM | POA: Diagnosis not present

## 2019-04-05 DIAGNOSIS — Z780 Asymptomatic menopausal state: Secondary | ICD-10-CM | POA: Diagnosis not present

## 2019-05-07 ENCOUNTER — Encounter: Payer: Self-pay | Admitting: Certified Nurse Midwife

## 2019-08-17 DIAGNOSIS — L723 Sebaceous cyst: Secondary | ICD-10-CM | POA: Diagnosis not present

## 2019-10-03 ENCOUNTER — Telehealth: Payer: Self-pay

## 2019-10-03 DIAGNOSIS — N3000 Acute cystitis without hematuria: Secondary | ICD-10-CM | POA: Diagnosis not present

## 2019-10-03 NOTE — Telephone Encounter (Signed)
AEX 01/2019 with DL, has next scheduled on 03/2020 with BS Menopausal, uses [employer] with pt. Pt states having UTI sx of urinary frequency, urgency, and burning x 2-3 days. Pt denies any fever, chills, back pain, abd cramps or pain.  Pt states has increased water intake, but sx have not resolved. Pt has not used any OTC meds for treatment.  Advised pt to have OV for further evaluation. Pt states [facility] at 5 pm. Advised there are no available appts, but will work-in. Pt declines and will [facility]. Pt advised to return call if sx do not resolve. Pt agreeable.  Encounter closed.

## 2019-10-03 NOTE — Telephone Encounter (Signed)
Patient is calling in regards to having UTI.No available appointments, need triage to assist.

## 2019-10-20 IMAGING — MG DIGITAL SCREENING BILATERAL MAMMOGRAM WITH TOMO AND CAD
8 series · 8 of 24 positions shown · non-contrast
Comparison: Previous exam(s).

CLINICAL DATA: Screening.

EXAM:
DIGITAL SCREENING BILATERAL MAMMOGRAM WITH TOMO AND CAD

[L MLO synth-2D]
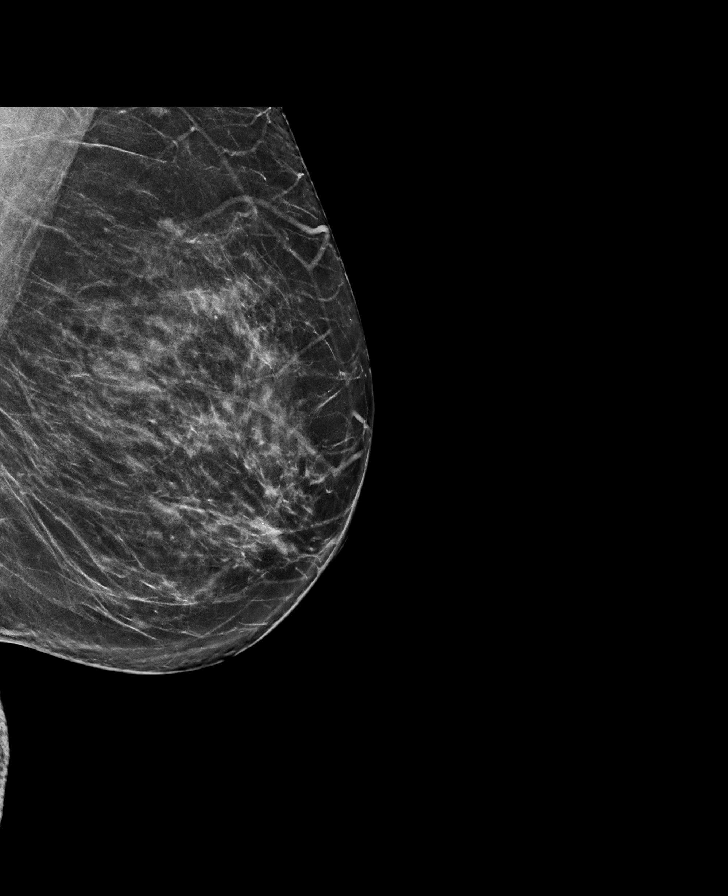

[R CC synth-2D]
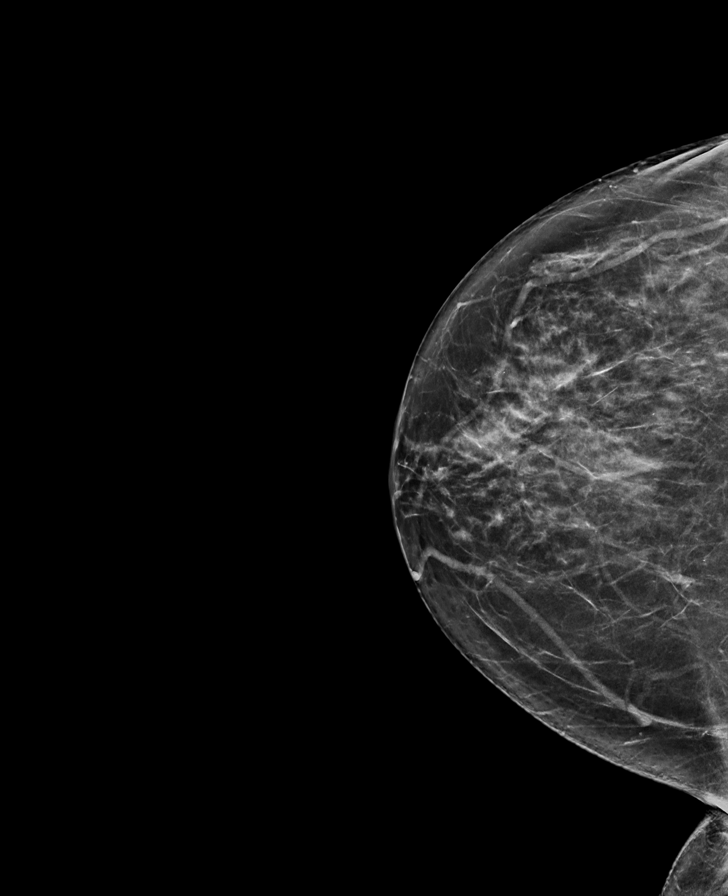

[R MLO synth-2D]
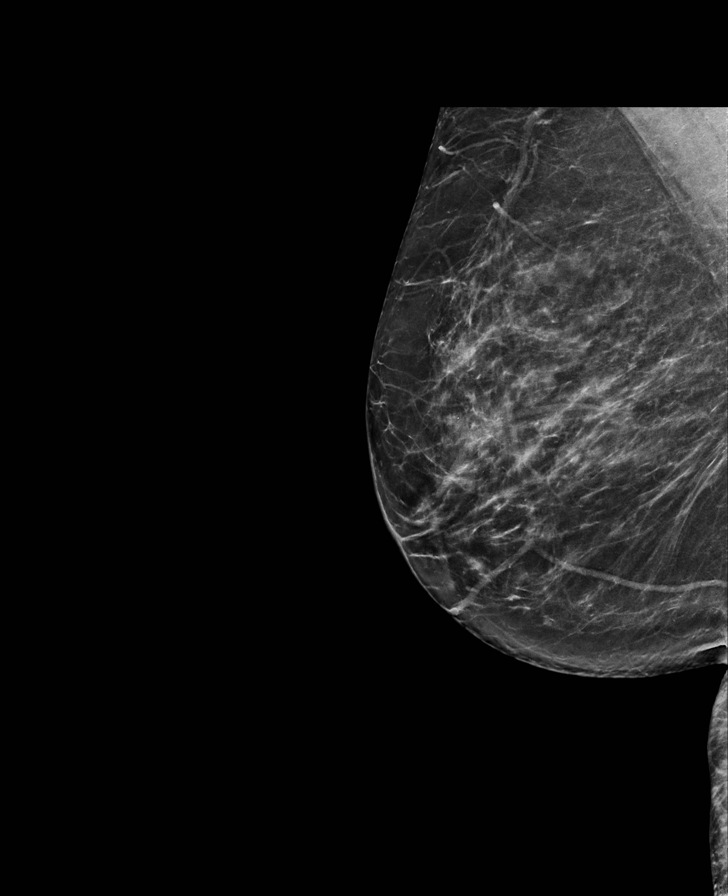

[L CC synth-2D]
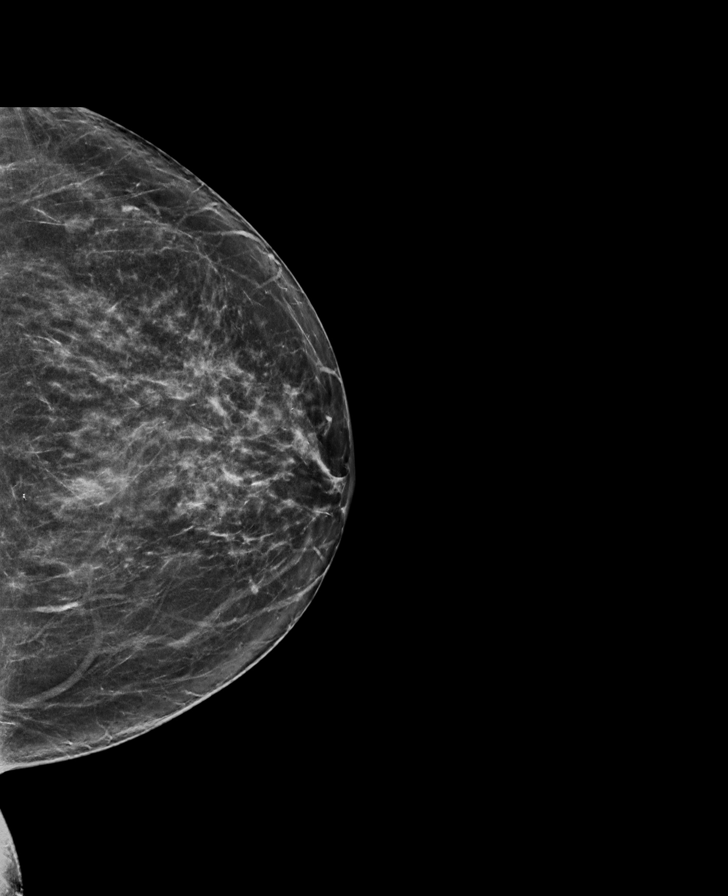

[R CC tomo · tomo slice 39/76.0]
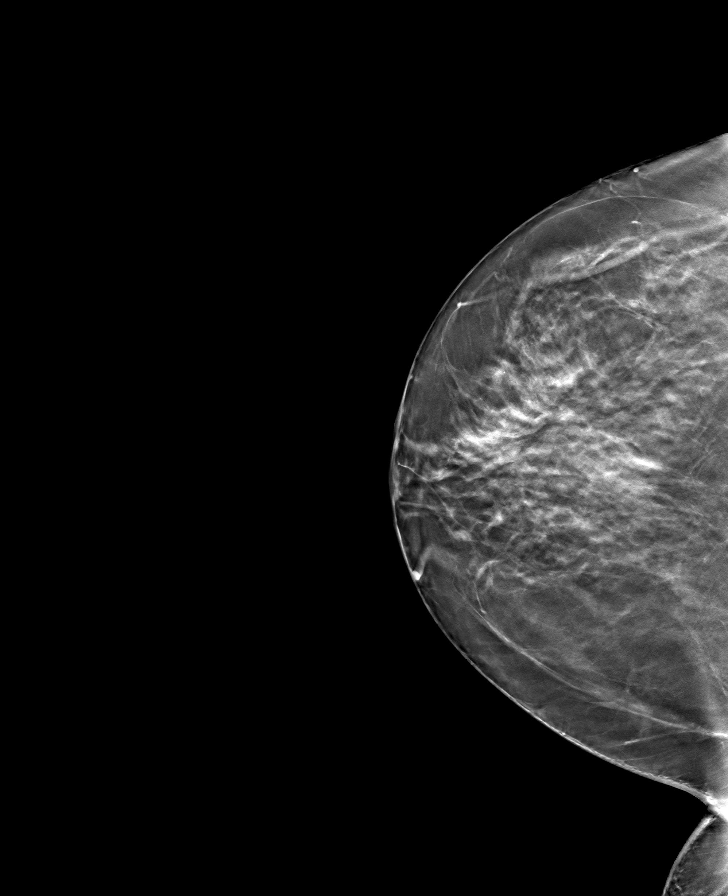

[L MLO tomo · tomo slice 39/76.0]
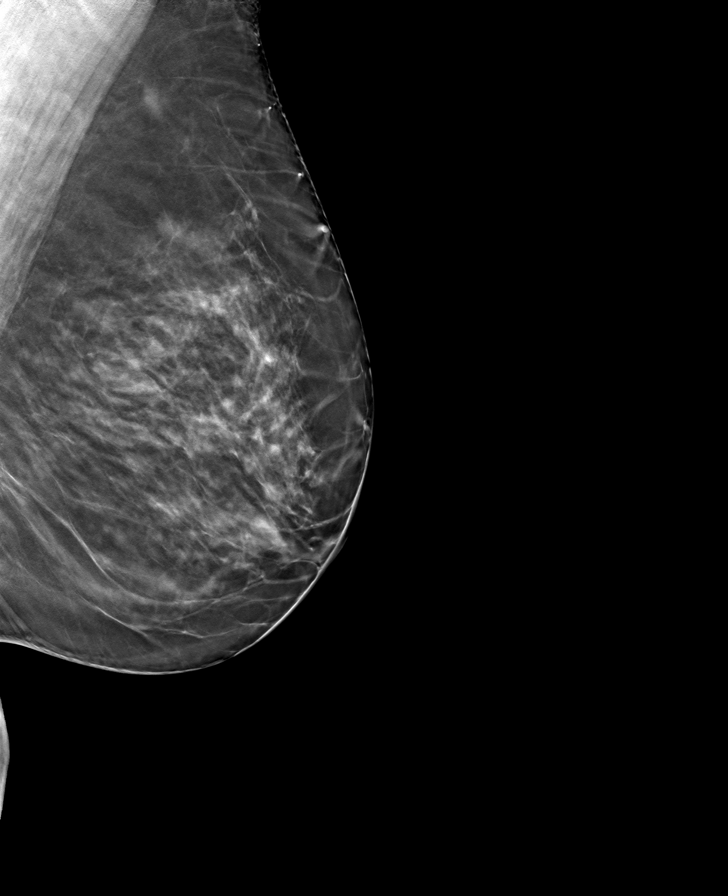

[L CC tomo · tomo slice 37/74.0]
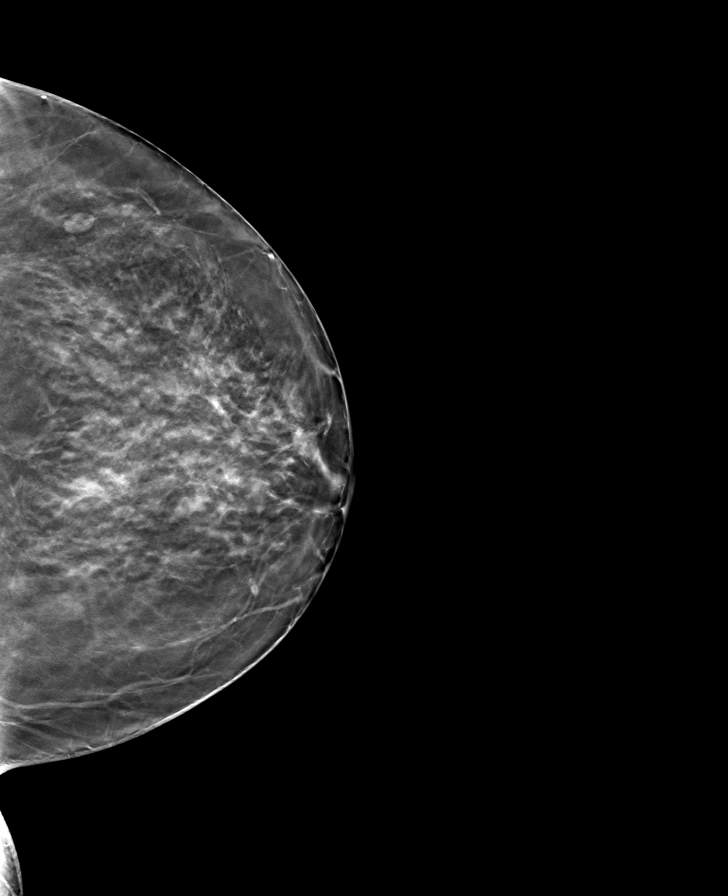

[R MLO tomo · tomo slice 36/71.0]
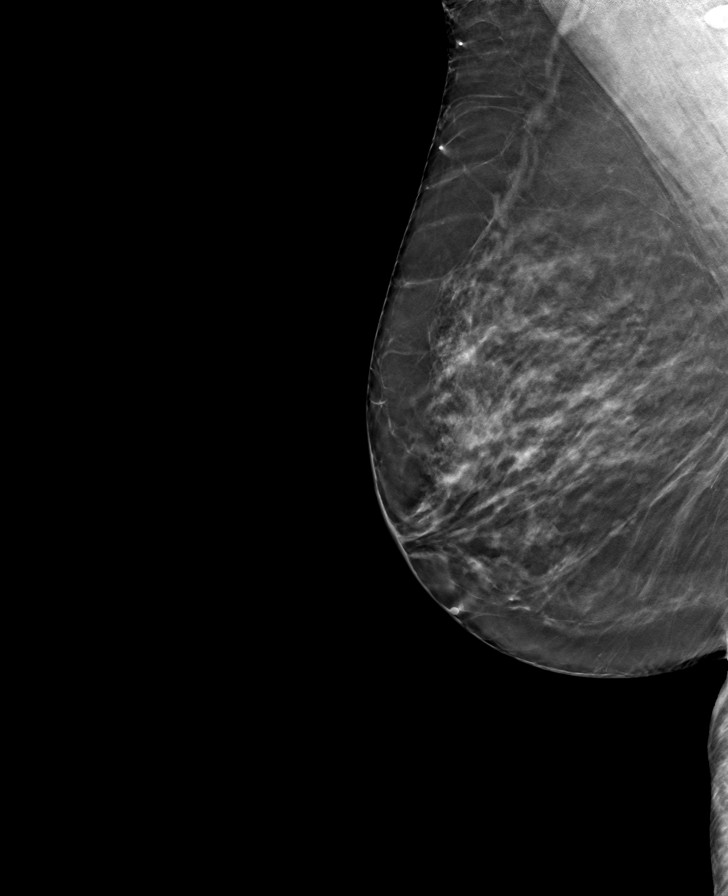

[8 of 24 positions shown; findings below may reference images not displayed]

ACR Breast Density Category c: The breast tissue is heterogeneously
dense, which may obscure small masses.
FINDINGS: There are no findings suspicious for malignancy. Images were
processed with CAD.
IMPRESSION: No mammographic evidence of malignancy. A result letter of this
screening mammogram will be mailed directly to the patient.

RECOMMENDATION:
Screening mammogram in one year. (Code:FT-U-LHB)

BI-RADS CATEGORY  1: Negative.

## 2019-11-18 DIAGNOSIS — Z20828 Contact with and (suspected) exposure to other viral communicable diseases: Secondary | ICD-10-CM | POA: Diagnosis not present

## 2019-11-23 DIAGNOSIS — Z23 Encounter for immunization: Secondary | ICD-10-CM | POA: Diagnosis not present

## 2020-01-21 ENCOUNTER — Ambulatory Visit: Payer: BC Managed Care – PPO | Admitting: Certified Nurse Midwife

## 2020-02-03 DIAGNOSIS — Z20822 Contact with and (suspected) exposure to covid-19: Secondary | ICD-10-CM | POA: Diagnosis not present

## 2020-03-10 DIAGNOSIS — L578 Other skin changes due to chronic exposure to nonionizing radiation: Secondary | ICD-10-CM | POA: Diagnosis not present

## 2020-03-10 DIAGNOSIS — D225 Melanocytic nevi of trunk: Secondary | ICD-10-CM | POA: Diagnosis not present

## 2020-03-10 DIAGNOSIS — L814 Other melanin hyperpigmentation: Secondary | ICD-10-CM | POA: Diagnosis not present

## 2020-03-10 DIAGNOSIS — L821 Other seborrheic keratosis: Secondary | ICD-10-CM | POA: Diagnosis not present

## 2020-03-19 ENCOUNTER — Other Ambulatory Visit (HOSPITAL_COMMUNITY)
Admission: RE | Admit: 2020-03-19 | Discharge: 2020-03-19 | Disposition: A | Discharge status: Home / Self Care | Payer: BC Managed Care – PPO | Source: Ambulatory Visit | Attending: Obstetrics and Gynecology | Admitting: Obstetrics and Gynecology

## 2020-03-19 ENCOUNTER — Ambulatory Visit (INDEPENDENT_AMBULATORY_CARE_PROVIDER_SITE_OTHER): Payer: BC Managed Care – PPO | Admitting: Obstetrics and Gynecology

## 2020-03-19 ENCOUNTER — Encounter: Payer: Self-pay | Admitting: Obstetrics and Gynecology

## 2020-03-19 ENCOUNTER — Other Ambulatory Visit: Payer: Self-pay

## 2020-03-19 VITALS — BP 114/68 | HR 58 | Ht 64.0 in | Wt 175.0 lb

## 2020-03-19 DIAGNOSIS — Z01419 Encounter for gynecological examination (general) (routine) without abnormal findings: Secondary | ICD-10-CM | POA: Diagnosis not present

## 2020-03-19 DIAGNOSIS — Z113 Encounter for screening for infections with a predominantly sexual mode of transmission: Secondary | ICD-10-CM | POA: Diagnosis not present

## 2020-03-19 DIAGNOSIS — R87610 Atypical squamous cells of undetermined significance on cytologic smear of cervix (ASC-US): Secondary | ICD-10-CM | POA: Insufficient documentation

## 2020-03-19 NOTE — Patient Instructions (Signed)

## 2020-03-19 NOTE — Progress Notes (Signed)
55 y.o. G84P0010 Divorced Caucasian female here for annual exam.  Changed her diet and doing Hello Fresh.  Lost 20 pounds and has gained some weight back.  Dry skin and hair.  Some night sweats in the last month.   Mirena removed in 2019. FSH was 124.5.  [facility].  Works for a [employer].   PCP:  Berneta Sages, MD   Patient's last menstrual period was 11/12/2013.           Sexually active: No.  The current method of family planning is post menopausal status.    Exercising: Yes.    treadmill, walking, exercise bike Smoker:  no  Health Maintenance: Pap: 01-16-19 Neg:Neg HR HPV, 01-06-18 Neg:Pos HR HPV, 12-16-16 ASCUS:Pos HR HPV History of abnormal Pap:  Yes, 01-26-18 colpo revealed cervical biopsies with inflammation and benign squamous epithelium;ECC benign squamous epithelium. 01-06-18 Neg:Pos HR HPV. 01-11-17 colpo revealed LSIL, 2nd bx showed koilocytic atypia c/w HR HPV effect;ECC benign quamous endocervical epithelium. 12-16-16 ASCUS:Pos HR HPV. MMG:  12-27-18 TDD:UKGURK2 --pt. Knows to schedule Colonoscopy: 2019 neg cologuard, 2009 neg colonoscopy--family hx colon cancer in [facility] BMD: 2020 Result :Osteopenia TDaP:  PCP Gardasil:   no HIV:2017 NR Hep C: no Screening Labs:  PCP.    reports that she quit smoking about 14 years ago. She has never used smokeless tobacco. She reports current alcohol use of about 4.0 - 6.0 standard drinks of alcohol per week. She reports that she does not use drugs.  Past Medical History:  Diagnosis Date  . Abnormal Pap smear of cervix    early 90's & 12-16-16 ASCUS HPV HR +, 01-06-18 neg HPV HR+  . AC (acromioclavicular) joint bone spurs    neck  . Arthritis    in the neck  . Depression   . Osteopenia   . Plantar fasciitis   . Shingles    in early 82s  . STD (sexually transmitted disease)    chlamydia in college    Past Surgical History:  Procedure Laterality Date  .  APPENDECTOMY  1979  . COLPOSCOPY     early 82's, 2018 CIN1  . INTRAUTERINE DEVICE INSERTION  10/04, 10/09   old mirena out/ insertion of new Mirena IUD, insertion again 12/13/12  . TRIGGER FINGER RELEASE  2021    Current Outpatient Medications  Medication Sig Dispense Refill  . ALPRAZolam (XANAX) 0.25 MG tablet   1  . Ascorbic Acid (VITAMIN C PO) Take 1,000 mg by mouth.    Marland Kitchen BIOTIN PO Take by mouth.    . Calcium Carbonate Antacid (TUMS PO) Take by mouth.    . Cholecalciferol (D3-1000) 25 MCG (1000 UT) capsule     . fexofenadine (ALLEGRA) 180 MG tablet Take 180 mg by mouth daily.    . fluticasone (FLONASE) 50 MCG/ACT nasal spray Place into both nostrils daily.    . montelukast (SINGULAIR) 10 MG tablet   4  . Multiple Vitamins-Minerals (MULTIVITAMIN PO) Take by mouth daily.    . Naproxen Sodium (ALEVE PO) Take by mouth as needed.     No current facility-administered medications for this visit.    Family History  Problem Relation Age of Onset  . Endometriosis Mother   . Hypertension Father   . Diverticulitis Father   . Leukemia Maternal Grandmother   . Cancer Paternal Grandfather        colon cancer  . Breast cancer Neg  Hx     Review of Systems  All other systems reviewed and are negative.   Exam:   BP 114/68 (Cuff Size: Large)   Pulse (!) 58   Ht 5\' 4"  (1.626 m)   Wt 175 lb (79.4 kg)   LMP 11/12/2013 Comment: spotting  SpO2 100%   BMI 30.04 kg/m     General appearance: alert, cooperative and appears stated age Head: normocephalic, without obvious abnormality, atraumatic Neck: no adenopathy, supple, symmetrical, trachea midline and thyroid normal to inspection and palpation Lungs: clear to auscultation bilaterally Breasts: normal appearance, no masses or tenderness, No nipple retraction or dimpling, No nipple discharge or bleeding, No axillary adenopathy Heart: regular rate and rhythm Abdomen: soft, non-tender; no masses, no organomegaly Extremities: extremities  normal, atraumatic, no cyanosis or edema Skin: skin color, texture, turgor normal. No rashes or lesions Lymph nodes: cervical, supraclavicular, and axillary nodes normal. Neurologic: grossly normal  Pelvic: External genitalia:  no lesions              No abnormal inguinal nodes palpated.              Urethra:  normal appearing urethra with no masses, tenderness or lesions              Bartholins and Skenes: normal                 Vagina: normal appearing vagina with normal color and discharge, no lesions              Cervix: no lesions              Pap taken: Yes.   Bimanual Exam:  Uterus:  normal size, contour, position, consistency, mobility, non-tender              Adnexa: no mass, fullness, tenderness              Rectal exam: Yes.  .  Confirms.              Anus:  normal sphincter tone, no lesions  Chaperone was present for exam.  Assessment:   Well woman visit with normal exam. Menopausal female. Hx positive HR HPV.  Osteopenia.   Plan: Mammogram screening discussed. Self breast awareness reviewed. Pap and HR HPV as above.  Trichomonas testing from pap.  Guidelines for Calcium, Vitamin D, regular exercise program including cardiovascular and weight bearing exercise. STD screening.  Routine labs with PCP.  Follow up annually and prn.

## 2020-03-20 ENCOUNTER — Encounter: Payer: Self-pay | Admitting: Obstetrics and Gynecology

## 2020-03-20 LAB — HEPATITIS B SURFACE ANTIGEN: Hepatitis B Surface Ag: NONREACTIVE

## 2020-03-20 LAB — HIV ANTIBODY (ROUTINE TESTING W REFLEX): HIV 1&2 Ab, 4th Generation: NONREACTIVE

## 2020-03-20 LAB — RPR: RPR Ser Ql: NONREACTIVE

## 2020-03-20 LAB — HEPATITIS C ANTIBODY
Hepatitis C Ab: NONREACTIVE
SIGNAL TO CUT-OFF: 0.03 (ref <1.00)

## 2020-03-25 LAB — CYTOLOGY - PAP
Comment: NEGATIVE
Comment: NEGATIVE
Diagnosis: UNDETERMINED — AB
High risk HPV: NEGATIVE
Trichomonas: NEGATIVE

## 2020-04-25 DIAGNOSIS — M859 Disorder of bone density and structure, unspecified: Secondary | ICD-10-CM | POA: Diagnosis not present

## 2020-04-25 DIAGNOSIS — E785 Hyperlipidemia, unspecified: Secondary | ICD-10-CM | POA: Diagnosis not present

## 2020-04-25 DIAGNOSIS — Z0000 Encounter for general adult medical examination without abnormal findings: Secondary | ICD-10-CM | POA: Diagnosis not present

## 2020-05-01 DIAGNOSIS — R82998 Other abnormal findings in urine: Secondary | ICD-10-CM | POA: Diagnosis not present

## 2020-05-01 DIAGNOSIS — E785 Hyperlipidemia, unspecified: Secondary | ICD-10-CM | POA: Diagnosis not present

## 2020-05-01 DIAGNOSIS — Z0000 Encounter for general adult medical examination without abnormal findings: Secondary | ICD-10-CM | POA: Diagnosis not present

## 2020-05-27 DIAGNOSIS — M8589 Other specified disorders of bone density and structure, multiple sites: Secondary | ICD-10-CM | POA: Diagnosis not present

## 2020-07-02 DIAGNOSIS — R059 Cough, unspecified: Secondary | ICD-10-CM | POA: Diagnosis not present

## 2020-07-02 DIAGNOSIS — R319 Hematuria, unspecified: Secondary | ICD-10-CM | POA: Diagnosis not present

## 2020-07-02 DIAGNOSIS — N390 Urinary tract infection, site not specified: Secondary | ICD-10-CM | POA: Diagnosis not present

## 2020-07-04 DIAGNOSIS — N390 Urinary tract infection, site not specified: Secondary | ICD-10-CM | POA: Diagnosis not present

## 2020-07-05 ENCOUNTER — Telehealth (HOSPITAL_BASED_OUTPATIENT_CLINIC_OR_DEPARTMENT_OTHER): Payer: Self-pay | Admitting: Obstetrics & Gynecology

## 2020-07-06 ENCOUNTER — Telehealth (HOSPITAL_BASED_OUTPATIENT_CLINIC_OR_DEPARTMENT_OTHER): Payer: Self-pay | Admitting: Obstetrics & Gynecology

## 2020-07-06 NOTE — Telephone Encounter (Signed)
Documentation regarding on call phone communication made in separate note.

## 2020-07-06 NOTE — Telephone Encounter (Signed)
Pt called Saturday, 5/21, with multiple questions.  Reports being treated with PCP for UTI and sinusitis.  Was treated for UTI after having hematuria and urgency.  Unsure what urine culture showed.  Those symptoms are better.  On Augmentin.  Now having some vulvar itching.  No odor.  No discharge.  No fever.  Not sure if needs treatment.  Having some low back cramping as well.  Has new sex partner but both had STD testing prior to starting relationship.  Wants recommendations about this and wants to know what of above is related.  Advised to get Monistat 3 OTC.  Also if starts having worsening back pain and hematuria, needs to be seen for renal stone evaluation.  Also advised to be seen if develops fever.  Pt desires appt early in week.  Will route to Glorianne Manchester, RN, to call her on Monday.

## 2020-07-07 ENCOUNTER — Telehealth: Payer: Self-pay | Admitting: *Deleted

## 2020-07-07 NOTE — Telephone Encounter (Signed)
Called patient.   See 07/07/20 telephone encounter.

## 2020-07-07 NOTE — Telephone Encounter (Signed)
Megan Salon, MD routed conversation to You; Nunzio Cobbs, MD 21 hours ago (1:41 PM)   Megan Salon, MD 21 hours ago (1:41 PM)       Pt called Saturday, 5/21, with multiple questions.  Reports being treated with PCP for UTI and sinusitis.  Was treated for UTI after having hematuria and urgency.  Unsure what urine culture showed.  Those symptoms are better.  On Augmentin.  Now having some vulvar itching.  No odor.  No discharge.  No fever.  Not sure if needs treatment.  Having some low back cramping as well.  Has new sex partner but both had STD testing prior to starting relationship.  Wants recommendations about this and wants to know what of above is related.  Advised to get Monistat 3 OTC.  Also if starts having worsening back pain and hematuria, needs to be seen for renal stone evaluation.  Also advised to be seen if develops fever.  Pt desires appt early in week.  Will route to Glorianne Manchester, RN, to call her on Monday.      Last AEX 03/19/20 w/ Dr. Quincy Simmonds  Call placed to patient. Patient reports she started OTC monistat on 5/21, symptoms of vaginal itching and pressure have improved. Denies any new symptoms. Denies flank pain, hematuria, fever/chills, N/V, odor, d/c. Denies STD concerns. Patient states she will f/u with PCP if any new UTI symptoms develop. Offered OV with Dr. Quincy Simmonds, patient declined. Will return call to office if symptoms do not resolve or new GYN symptoms develop. Patient thankful for call.   Routing to provider for final review. Patient is agreeable to disposition. Will close encounter.

## 2020-07-11 ENCOUNTER — Telehealth: Payer: Self-pay | Admitting: *Deleted

## 2020-07-11 DIAGNOSIS — R350 Frequency of micturition: Secondary | ICD-10-CM | POA: Diagnosis not present

## 2020-07-11 DIAGNOSIS — R3915 Urgency of urination: Secondary | ICD-10-CM | POA: Diagnosis not present

## 2020-07-11 NOTE — Telephone Encounter (Signed)
Patient called c/o recurrent UTI, reports her PCP treated her for UTI last week now symptoms have returned. Urinary frequency and burning with urination only, I explained no appointments available today and she would need office visit for treatment. I suggest patient try Urgent Care to help relieve,patient said she will follow up at Urgent Care.

## 2020-07-22 ENCOUNTER — Other Ambulatory Visit: Payer: Self-pay

## 2020-07-22 ENCOUNTER — Ambulatory Visit
Admission: RE | Admit: 2020-07-22 | Discharge: 2020-07-22 | Disposition: A | Discharge status: Home / Self Care | Payer: BC Managed Care – PPO | Source: Ambulatory Visit | Attending: Internal Medicine | Admitting: Internal Medicine

## 2020-07-22 ENCOUNTER — Other Ambulatory Visit: Payer: Self-pay | Admitting: Internal Medicine

## 2020-07-22 DIAGNOSIS — K5730 Diverticulosis of large intestine without perforation or abscess without bleeding: Secondary | ICD-10-CM | POA: Diagnosis not present

## 2020-07-22 DIAGNOSIS — R319 Hematuria, unspecified: Secondary | ICD-10-CM | POA: Diagnosis not present

## 2020-07-22 DIAGNOSIS — R109 Unspecified abdominal pain: Secondary | ICD-10-CM | POA: Diagnosis not present

## 2020-10-03 DIAGNOSIS — E785 Hyperlipidemia, unspecified: Secondary | ICD-10-CM | POA: Diagnosis not present

## 2020-10-03 DIAGNOSIS — R319 Hematuria, unspecified: Secondary | ICD-10-CM | POA: Diagnosis not present

## 2020-11-07 DIAGNOSIS — R3915 Urgency of urination: Secondary | ICD-10-CM | POA: Diagnosis not present

## 2020-11-07 DIAGNOSIS — R3982 Chronic bladder pain: Secondary | ICD-10-CM | POA: Diagnosis not present

## 2020-11-07 DIAGNOSIS — D3001 Benign neoplasm of right kidney: Secondary | ICD-10-CM | POA: Diagnosis not present

## 2020-11-07 DIAGNOSIS — R3121 Asymptomatic microscopic hematuria: Secondary | ICD-10-CM | POA: Diagnosis not present

## 2020-11-20 DIAGNOSIS — M6289 Other specified disorders of muscle: Secondary | ICD-10-CM | POA: Diagnosis not present

## 2020-11-20 DIAGNOSIS — M6281 Muscle weakness (generalized): Secondary | ICD-10-CM | POA: Diagnosis not present

## 2020-11-20 DIAGNOSIS — M62838 Other muscle spasm: Secondary | ICD-10-CM | POA: Diagnosis not present

## 2020-11-20 DIAGNOSIS — R3982 Chronic bladder pain: Secondary | ICD-10-CM | POA: Diagnosis not present

## 2020-11-28 DIAGNOSIS — M6281 Muscle weakness (generalized): Secondary | ICD-10-CM | POA: Diagnosis not present

## 2020-11-28 DIAGNOSIS — R102 Pelvic and perineal pain: Secondary | ICD-10-CM | POA: Diagnosis not present

## 2020-11-28 DIAGNOSIS — M62838 Other muscle spasm: Secondary | ICD-10-CM | POA: Diagnosis not present

## 2020-11-28 DIAGNOSIS — M6289 Other specified disorders of muscle: Secondary | ICD-10-CM | POA: Diagnosis not present

## 2020-12-01 DIAGNOSIS — Z23 Encounter for immunization: Secondary | ICD-10-CM | POA: Diagnosis not present

## 2020-12-11 DIAGNOSIS — R058 Other specified cough: Secondary | ICD-10-CM | POA: Diagnosis not present

## 2020-12-11 DIAGNOSIS — J069 Acute upper respiratory infection, unspecified: Secondary | ICD-10-CM | POA: Diagnosis not present

## 2020-12-19 DIAGNOSIS — H52202 Unspecified astigmatism, left eye: Secondary | ICD-10-CM | POA: Diagnosis not present

## 2020-12-19 DIAGNOSIS — H35363 Drusen (degenerative) of macula, bilateral: Secondary | ICD-10-CM | POA: Diagnosis not present

## 2020-12-19 DIAGNOSIS — H524 Presbyopia: Secondary | ICD-10-CM | POA: Diagnosis not present

## 2020-12-19 DIAGNOSIS — H1789 Other corneal scars and opacities: Secondary | ICD-10-CM | POA: Diagnosis not present

## 2021-01-28 ENCOUNTER — Other Ambulatory Visit: Payer: Self-pay | Admitting: Internal Medicine

## 2021-01-28 DIAGNOSIS — M6289 Other specified disorders of muscle: Secondary | ICD-10-CM | POA: Diagnosis not present

## 2021-01-28 DIAGNOSIS — M62838 Other muscle spasm: Secondary | ICD-10-CM | POA: Diagnosis not present

## 2021-01-28 DIAGNOSIS — M6281 Muscle weakness (generalized): Secondary | ICD-10-CM | POA: Diagnosis not present

## 2021-01-28 DIAGNOSIS — R102 Pelvic and perineal pain: Secondary | ICD-10-CM | POA: Diagnosis not present

## 2021-01-28 DIAGNOSIS — Z1231 Encounter for screening mammogram for malignant neoplasm of breast: Secondary | ICD-10-CM

## 2021-02-03 ENCOUNTER — Ambulatory Visit
Admission: RE | Admit: 2021-02-03 | Discharge: 2021-02-03 | Disposition: A | Discharge status: Home / Self Care | Payer: BC Managed Care – PPO | Source: Ambulatory Visit

## 2021-02-03 DIAGNOSIS — Z1231 Encounter for screening mammogram for malignant neoplasm of breast: Secondary | ICD-10-CM | POA: Diagnosis not present

## 2021-02-19 DIAGNOSIS — M6281 Muscle weakness (generalized): Secondary | ICD-10-CM | POA: Diagnosis not present

## 2021-02-19 DIAGNOSIS — M62838 Other muscle spasm: Secondary | ICD-10-CM | POA: Diagnosis not present

## 2021-02-19 DIAGNOSIS — R102 Pelvic and perineal pain: Secondary | ICD-10-CM | POA: Diagnosis not present

## 2021-02-19 DIAGNOSIS — M6289 Other specified disorders of muscle: Secondary | ICD-10-CM | POA: Diagnosis not present

## 2021-03-10 DIAGNOSIS — M6281 Muscle weakness (generalized): Secondary | ICD-10-CM | POA: Diagnosis not present

## 2021-03-10 DIAGNOSIS — M62838 Other muscle spasm: Secondary | ICD-10-CM | POA: Diagnosis not present

## 2021-03-10 DIAGNOSIS — M6289 Other specified disorders of muscle: Secondary | ICD-10-CM | POA: Diagnosis not present

## 2021-03-10 DIAGNOSIS — R102 Pelvic and perineal pain: Secondary | ICD-10-CM | POA: Diagnosis not present

## 2021-03-25 DIAGNOSIS — R3982 Chronic bladder pain: Secondary | ICD-10-CM | POA: Diagnosis not present

## 2021-03-25 DIAGNOSIS — M6281 Muscle weakness (generalized): Secondary | ICD-10-CM | POA: Diagnosis not present

## 2021-03-25 DIAGNOSIS — R3915 Urgency of urination: Secondary | ICD-10-CM | POA: Diagnosis not present

## 2021-03-25 DIAGNOSIS — R102 Pelvic and perineal pain: Secondary | ICD-10-CM | POA: Diagnosis not present

## 2021-04-20 NOTE — Progress Notes (Signed)
56 y.o. G60P0010 Divorced Caucasian female here for annual exam.   ? ?Patient wants STD testing. ?New partner.  ? ?Diagnosed with pelvic floor stress last April. Also diagnosed with a renal angiomyolipoma.(See CT scan in Epic). ? ?Saw urology for urinary frequency.  ?Ultimately she did not have UTIs.  ?She did pelvic floor therapy through [facility].  ? ?PCP:  Berneta Sages, MD  ? ?Patient's last menstrual period was 11/12/2013.     ?  ?    ?Sexually active: Yes.    ?The current method of family planning is post menopausal status.    ?Exercising: Yes.     Walking, some weights ?Smoker:  no ? ?Health Maintenance: ?Pap:   03-19-20 ASCUS:Neg HR HPV, 01-16-19 Neg:Neg HR HPV, 01-06-18 Neg:Pos HR HPV ?History of abnormal Pap:  Yes, 01-26-18 colpo revealed cervical biopsies with inflammation and benign squamous epithelium;ECC benign squamous epithelium. 01-06-18 Neg:Pos HR HPV. 01-11-17 colpo revealed LSIL, 2nd bx showed koilocytic atypia c/w HR HPV effect;ECC benign quamous endocervical epithelium. 12-16-16 ASCUS:Pos HR HPV. ?MMG:  02-03-21 Neg/BiRads1 ?Colonoscopy:   2019 neg cologuard, 2009 neg colonoscopy--family hx colon cancer in [facility] ?BMD:   2020  Result :Osteopenia - PCP office.  ?TDaP:  PCP ?Gardasil:   no ?HIV: 03-19-20 NR ?Hep C: 03-19-20 Neg ?Screening Labs:  PCP ? ? reports that she quit smoking about 15 years ago. Her smoking use included cigarettes. She has never used smokeless tobacco. She reports current alcohol use of about 2.0 standard drinks per week. She reports that she does not use drugs. ? ?Past Medical History:  ?Diagnosis Date  ? Abnormal Pap smear of cervix   ? early 90's & 12-16-16 ASCUS HPV HR +, 01-06-18 neg HPV HR+  ? AC (acromioclavicular) joint bone spurs   ? neck  ? Arthritis   ? in the neck  ? Depression   ? Osteopenia   ? Plantar fasciitis   ? Shingles   ? in early 56s  ? STD (sexually transmitted disease)   ? chlamydia in college  ? ? ?Past Surgical History:  ?Procedure Laterality Date  ?  APPENDECTOMY  1979  ? COLPOSCOPY    ? early 90's, 2018 CIN1  ? INTRAUTERINE DEVICE INSERTION  10/04, 10/09  ? old mirena out/ insertion of new Mirena IUD, insertion again 12/13/12  ? TRIGGER FINGER RELEASE  2021  ? ? ?Current Outpatient Medications  ?Medication Sig Dispense Refill  ? ALPRAZolam (XANAX) 0.25 MG tablet   1  ? Ascorbic Acid (VITAMIN C PO) Take 1,000 mg by mouth.    ? BIOTIN PO Take by mouth.    ? Calcium Carbonate Antacid (TUMS PO) Take by mouth.    ? Cholecalciferol (D3-1000) 25 MCG (1000 UT) capsule     ? diazepam (VALIUM) 10 MG tablet Take 10 mg by mouth daily as needed.    ? fexofenadine (ALLEGRA) 180 MG tablet Take 180 mg by mouth daily.    ? fluticasone (FLONASE) 50 MCG/ACT nasal spray Place into both nostrils daily.    ? montelukast (SINGULAIR) 10 MG tablet   4  ? Multiple Vitamins-Minerals (MULTIVITAMIN PO) Take by mouth daily.    ? Naproxen Sodium (ALEVE PO) Take by mouth as needed.    ? ?No current facility-administered medications for this visit.  ? ? ?Family History  ?Problem Relation Age of Onset  ? Endometriosis Mother   ? Hypertension Father   ? Diverticulitis Father   ? Leukemia Maternal Grandmother   ? Cancer  Paternal Grandfather   ?     colon cancer  ? Breast cancer Neg Hx   ? ? ?Review of Systems  ?All other systems reviewed and are negative. ? ?Exam:   ?BP 110/74   Pulse 65   Ht '5\' 4"'$  (1.626 m)   Wt 165 lb (74.8 kg)   LMP 11/12/2013 Comment: spotting  SpO2 99%   BMI 28.32 kg/m?     ?General appearance: alert, cooperative and appears stated age ?Head: normocephalic, without obvious abnormality, atraumatic ?Neck: no adenopathy, supple, symmetrical, trachea midline and thyroid enlarged with possible left thyroid nodule.  ?Lungs: clear to auscultation bilaterally ?Breasts: normal appearance, no masses or tenderness, No nipple retraction or dimpling, No nipple discharge or bleeding, No axillary adenopathy ?Heart: regular rate and rhythm ?Abdomen: soft, non-tender; no masses, no  organomegaly ?Extremities: extremities normal, atraumatic, no cyanosis or edema ?Skin: skin color, texture, turgor normal. No rashes or lesions ?Lymph nodes: cervical, supraclavicular, and axillary nodes normal. ?Neurologic: grossly normal ? ?Pelvic: External genitalia:  no lesions ?             No abnormal inguinal nodes palpated. ?             Urethra:  normal appearing urethra with no masses, tenderness or lesions ?             Bartholins and Skenes: normal    ?             Vagina: normal appearing vagina with normal color and discharge, no lesions ?             Cervix: no lesions.  Stenotic.  ?             Pap taken: yes ?Bimanual Exam:  Uterus:  normal size, contour, position, consistency, mobility, non-tender ?             Adnexa: no mass, fullness, tenderness ?             Rectal exam: yes.  Confirms. ?             Anus:  normal sphincter tone, no lesions ? ?Chaperone was present for exam:  Estill Bamberg, CMA ? ?Assessment:   ?Well woman visit with gynecologic exam. ?Menopausal female.  ?Hx LGSIL and positive HR HPV.  ?Thyroid enlargement and possible left thyroid nodule ?Osteopenia.  ?STD/infectious disease screening.  ? ?Plan: ?Mammogram screening discussed. ?Self breast awareness reviewed. ?Pap and HR HPV as above. ?Guidelines for Calcium, Vitamin D, regular exercise program including cardiovascular and weight bearing exercise. ?HIV, RPR, Hep C aby, GC/CT/trich testing.  ?TSH, free T3 and free T4. ?Proceed with thyroid ultrasound.   ?BMD with PCP.  ?Follow up annually and prn.  ? ?After visit summary provided.  ? ? ? ?

## 2021-04-21 ENCOUNTER — Ambulatory Visit (INDEPENDENT_AMBULATORY_CARE_PROVIDER_SITE_OTHER): Payer: BC Managed Care – PPO | Admitting: Obstetrics and Gynecology

## 2021-04-21 ENCOUNTER — Encounter: Payer: Self-pay | Admitting: Obstetrics and Gynecology

## 2021-04-21 ENCOUNTER — Other Ambulatory Visit: Payer: Self-pay

## 2021-04-21 ENCOUNTER — Telehealth: Payer: Self-pay | Admitting: Obstetrics and Gynecology

## 2021-04-21 ENCOUNTER — Other Ambulatory Visit (HOSPITAL_COMMUNITY)
Admission: RE | Admit: 2021-04-21 | Discharge: 2021-04-21 | Disposition: A | Discharge status: Home / Self Care | Payer: BC Managed Care – PPO | Source: Ambulatory Visit | Attending: Obstetrics and Gynecology | Admitting: Obstetrics and Gynecology

## 2021-04-21 VITALS — BP 110/74 | HR 65 | Ht 64.0 in | Wt 165.0 lb

## 2021-04-21 DIAGNOSIS — Z124 Encounter for screening for malignant neoplasm of cervix: Secondary | ICD-10-CM

## 2021-04-21 DIAGNOSIS — E049 Nontoxic goiter, unspecified: Secondary | ICD-10-CM

## 2021-04-21 DIAGNOSIS — Z87410 Personal history of cervical dysplasia: Secondary | ICD-10-CM

## 2021-04-21 DIAGNOSIS — Z113 Encounter for screening for infections with a predominantly sexual mode of transmission: Secondary | ICD-10-CM | POA: Insufficient documentation

## 2021-04-21 DIAGNOSIS — Z119 Encounter for screening for infectious and parasitic diseases, unspecified: Secondary | ICD-10-CM | POA: Diagnosis not present

## 2021-04-21 DIAGNOSIS — Z01419 Encounter for gynecological examination (general) (routine) without abnormal findings: Secondary | ICD-10-CM | POA: Diagnosis not present

## 2021-04-21 NOTE — Patient Instructions (Signed)

## 2021-04-21 NOTE — Telephone Encounter (Signed)
Please schedule thyroid ultrasound at [facility].  ? ?Patient has an enlarged thyroid and possible left thyroid nodule.  ? ? ?

## 2021-04-22 LAB — HEPATITIS C ANTIBODY
Hepatitis C Ab: NONREACTIVE
SIGNAL TO CUT-OFF: 0.51 (ref <1.00)

## 2021-04-22 LAB — RPR: RPR Ser Ql: NONREACTIVE

## 2021-04-22 LAB — HIV ANTIBODY (ROUTINE TESTING W REFLEX): HIV 1&2 Ab, 4th Generation: NONREACTIVE

## 2021-04-22 LAB — T4, FREE: Free T4: 1.1 ng/dL (ref 0.8–1.8)

## 2021-04-22 LAB — T3, FREE: T3, Free: 3.4 pg/mL (ref 2.3–4.2)

## 2021-04-22 LAB — TSH: TSH: 1.63 mIU/L (ref 0.40–4.50)

## 2021-04-22 NOTE — Telephone Encounter (Signed)
FYI. Scheduled 04/27/21.  ?

## 2021-04-24 ENCOUNTER — Encounter: Payer: Self-pay | Admitting: Obstetrics and Gynecology

## 2021-04-27 ENCOUNTER — Ambulatory Visit
Admission: RE | Admit: 2021-04-27 | Discharge: 2021-04-27 | Disposition: A | Discharge status: Home / Self Care | Payer: BC Managed Care – PPO | Source: Ambulatory Visit | Attending: Obstetrics and Gynecology | Admitting: Obstetrics and Gynecology

## 2021-04-27 DIAGNOSIS — E049 Nontoxic goiter, unspecified: Secondary | ICD-10-CM

## 2021-04-27 LAB — CYTOLOGY - PAP
Chlamydia: NEGATIVE
Comment: NEGATIVE
Comment: NEGATIVE
Comment: NEGATIVE
Comment: NORMAL
High risk HPV: POSITIVE — AB
Neisseria Gonorrhea: NEGATIVE
Trichomonas: NEGATIVE

## 2021-04-29 ENCOUNTER — Other Ambulatory Visit: Payer: Self-pay

## 2021-04-29 DIAGNOSIS — R87612 Low grade squamous intraepithelial lesion on cytologic smear of cervix (LGSIL): Secondary | ICD-10-CM

## 2021-05-19 DIAGNOSIS — E785 Hyperlipidemia, unspecified: Secondary | ICD-10-CM | POA: Diagnosis not present

## 2021-05-19 DIAGNOSIS — M859 Disorder of bone density and structure, unspecified: Secondary | ICD-10-CM | POA: Diagnosis not present

## 2021-05-21 ENCOUNTER — Encounter: Payer: Self-pay | Admitting: Gastroenterology

## 2021-05-21 DIAGNOSIS — L814 Other melanin hyperpigmentation: Secondary | ICD-10-CM | POA: Diagnosis not present

## 2021-05-21 DIAGNOSIS — D225 Melanocytic nevi of trunk: Secondary | ICD-10-CM | POA: Diagnosis not present

## 2021-05-21 DIAGNOSIS — L578 Other skin changes due to chronic exposure to nonionizing radiation: Secondary | ICD-10-CM | POA: Diagnosis not present

## 2021-05-21 DIAGNOSIS — L821 Other seborrheic keratosis: Secondary | ICD-10-CM | POA: Diagnosis not present

## 2021-05-26 DIAGNOSIS — Z1331 Encounter for screening for depression: Secondary | ICD-10-CM | POA: Diagnosis not present

## 2021-05-26 DIAGNOSIS — R82998 Other abnormal findings in urine: Secondary | ICD-10-CM | POA: Diagnosis not present

## 2021-05-26 DIAGNOSIS — Z0000 Encounter for general adult medical examination without abnormal findings: Secondary | ICD-10-CM | POA: Diagnosis not present

## 2021-05-26 DIAGNOSIS — E785 Hyperlipidemia, unspecified: Secondary | ICD-10-CM | POA: Diagnosis not present

## 2021-05-26 DIAGNOSIS — Z1339 Encounter for screening examination for other mental health and behavioral disorders: Secondary | ICD-10-CM | POA: Diagnosis not present

## 2021-06-01 NOTE — Progress Notes (Signed)
?  Subjective:  ?  ? Patient ID: Jaime Rogers, female   DOB: 11/24/1965, 56 y.o.   MRN: [id number] ? ?HPI ?Patient here today for colposcopy with pap 04-21-21 LSIL:Pos HR HPV. ? ?PAP HISTORY: ?04-21-21 LSIL:Pos HR HPV ? 03-19-20 ASCUS:Neg HR HPV, 01-16-19 Neg:Neg HR HPV, 01-06-18 Neg:Pos HR HPV. ?01-26-18 colpo revealed cervical biopsies with inflammation and benign squamous epithelium;ECC benign squamous epithelium. 01-06-18 Neg:Pos HR HPV. 01-11-17 colpo revealed LSIL, 2nd bx showed koilocytic atypia c/w HR HPV effect;ECC benign quamous endocervical epithelium. 12-16-16 ASCUS:Pos HR HPV. ? ? ?Review of Systems  ?All other systems reviewed and are negative. ?LMP: PMP ?Contraception: PMP ? ?   ?Objective:  ? Physical Exam ? ?Colposcopy of cervix and vagina.  ?Consent for procedure.  ?3% acetic acid used.  ?White light and green light filter used. ?Cervix:  stenotic.   ?7 mm area of raised white change at 6:00.  ?No vaginal lesions noted.  ?Hibiclens used.  ?Tenaculum to anterior cervical lip.  ?Os finder used to dilate the cervix.  ?ECC taken and sent to pathology. ?Biopsy at 6:00 taken and sent to pathology.  ?Monsel's placed. ?Minimal EBL.  ?No complications.   ?   ?Assessment:  ?   ?LGSIL pap. ?Positive HR HPV.  ?   ?Plan:  ?   ?FU biopsy results.  ?Not a candidate for Gardasil vaccine.  ?We discussed HPV colposcopy and LEEP briefly.  ?   ?

## 2021-06-04 ENCOUNTER — Ambulatory Visit: Payer: BC Managed Care – PPO | Admitting: Obstetrics and Gynecology

## 2021-06-04 ENCOUNTER — Other Ambulatory Visit (HOSPITAL_COMMUNITY)
Admission: RE | Admit: 2021-06-04 | Discharge: 2021-06-04 | Disposition: A | Discharge status: Home / Self Care | Payer: BC Managed Care – PPO | Source: Ambulatory Visit | Attending: Obstetrics and Gynecology | Admitting: Obstetrics and Gynecology

## 2021-06-04 ENCOUNTER — Encounter: Payer: Self-pay | Admitting: Obstetrics and Gynecology

## 2021-06-04 VITALS — BP 122/68 | Ht 64.0 in | Wt 165.0 lb

## 2021-06-04 DIAGNOSIS — R87810 Cervical high risk human papillomavirus (HPV) DNA test positive: Secondary | ICD-10-CM

## 2021-06-04 DIAGNOSIS — R87612 Low grade squamous intraepithelial lesion on cytologic smear of cervix (LGSIL): Secondary | ICD-10-CM

## 2021-06-04 DIAGNOSIS — N72 Inflammatory disease of cervix uteri: Secondary | ICD-10-CM | POA: Diagnosis not present

## 2021-06-04 NOTE — Patient Instructions (Signed)
Colposcopy, Care After  The following information offers guidance on how to care for yourself after your procedure. Your doctor may also give you more specific instructions. If you have problems or questions, contact your doctor. What can I expect after the procedure? If you did not have a sample of your tissue taken out (did not have a biopsy), you may only have some spotting of blood for a few days. You can go back to your normal activities. If you had a sample of your tissue taken out, it is common to have: Soreness and mild pain. These may last for a few days. Mild bleeding or fluid (discharge) coming from your vagina. The fluid will look dark and grainy. You may have this for a few days. The fluid may be caused by a liquid that was used during your procedure. You may need to wear a sanitary pad. Spotting of blood for at least 48 hours after the procedure. Follow these instructions at home: Medicines Take over-the-counter and prescription medicines only as told by your doctor. Ask your doctor what over-the-counter pain medicines and prescription medicines you can start taking again. This is very important if you take blood thinners. Activity For at least 3 days, or for as long as told by your doctor, avoid: Douching. Using tampons. Having sex. Return to your normal activities as told by your doctor. Ask your doctor what activities are safe for you. General instructions Ask your doctor if you may take baths, swim, or use a hot tub. You may take showers. If you use birth control (contraception), keep using it. Keep all follow-up visits. Contact a doctor if: You have a fever or chills. You faint or feel light-headed. Get help right away if: You bleed a lot from your vagina. A lot of bleeding means that the bleeding soaks through a pad in less than 1 hour. You have clumps of blood (blood clots) coming from your vagina. You have signs that could mean you have an infection. This may be  fluid coming from your vagina that is: Different than normal. Yellow. Bad-smelling. You have very bad pain or cramps in your lower belly that do not get better with medicine. Summary If you did not have a sample of your tissue taken out, you may only have some spotting of blood for a few days. You can go back to your normal activities. If you had a sample of your tissue taken out, it is common to have mild pain for a few days and spotting for 48 hours. Avoid douching, using tampons, and having sex for at least 3 days after the procedure or for as long as told. Get help right away if you have a lot of bleeding, very bad pain, or signs of infection. This information is not intended to replace advice given to you by your health care provider. Make sure you discuss any questions you have with your health care provider. Document Revised: 06/29/2020 Document Reviewed: 06/29/2020 Elsevier Patient Education  2023 [employer]  

## 2021-06-12 LAB — SURGICAL PATHOLOGY

## 2021-07-07 ENCOUNTER — Ambulatory Visit (AMBULATORY_SURGERY_CENTER): Payer: BC Managed Care – PPO | Admitting: *Deleted

## 2021-07-07 VITALS — Ht 64.0 in | Wt 162.0 lb

## 2021-07-07 DIAGNOSIS — Z1211 Encounter for screening for malignant neoplasm of colon: Secondary | ICD-10-CM

## 2021-07-07 MED ORDER — NA SULFATE-K SULFATE-MG SULF 17.5-3.13-1.6 GM/177ML PO SOLN: 1.0000 | Freq: Once | ORAL | 0 refills | Status: AC

## 2021-07-07 NOTE — Progress Notes (Signed)

## 2021-07-22 ENCOUNTER — Encounter: Payer: Self-pay | Admitting: Gastroenterology

## 2021-07-28 ENCOUNTER — Ambulatory Visit (AMBULATORY_SURGERY_CENTER): Payer: BC Managed Care – PPO | Admitting: Gastroenterology

## 2021-07-28 ENCOUNTER — Encounter: Payer: Self-pay | Admitting: Gastroenterology

## 2021-07-28 VITALS — BP 118/63 | HR 54 | Temp 96.2°F | Resp 11 | Ht 64.0 in | Wt 162.0 lb

## 2021-07-28 DIAGNOSIS — D125 Benign neoplasm of sigmoid colon: Secondary | ICD-10-CM

## 2021-07-28 DIAGNOSIS — Z1211 Encounter for screening for malignant neoplasm of colon: Secondary | ICD-10-CM

## 2021-07-28 DIAGNOSIS — Z8371 Family history of colonic polyps: Secondary | ICD-10-CM | POA: Diagnosis not present

## 2021-07-28 DIAGNOSIS — D123 Benign neoplasm of transverse colon: Secondary | ICD-10-CM

## 2021-07-28 DIAGNOSIS — K635 Polyp of colon: Secondary | ICD-10-CM

## 2021-07-28 MED ORDER — SODIUM CHLORIDE 0.9 % IV SOLN: 500.0000 mL | Freq: Once | INTRAVENOUS | Status: DC

## 2021-07-28 NOTE — Op Note (Signed)
[name] Procedure Date: 07/28/2021 8:51 AM MRN: [id number] Endoscopist: Ladene Artist , MD Age: 56 Referring MD:  Date of Birth: 11-02-1965 Gender: Female Account #: [id number] Procedure:                Colonoscopy Indications:              Colon cancer screening in patient at increased                            risk: Family history of 1st-degree relative with                            colon polyps Medicines:                Monitored Anesthesia Care Procedure:                Pre-Anesthesia Assessment:                           - Prior to the procedure, a History and Physical                            was performed, and patient medications and                            allergies were reviewed. The patient's tolerance of                            previous anesthesia was also reviewed. The risks                            and benefits of the procedure and the sedation                            options and risks were discussed with the patient.                            All questions were answered, and informed consent                            was obtained. Prior Anticoagulants: The patient has                            taken no previous anticoagulant or antiplatelet                            agents. ASA Grade Assessment: II - A patient with                            mild systemic disease. After reviewing the risks                            and benefits, the patient was deemed in  satisfactory condition to undergo the procedure.                           After obtaining informed consent, the colonoscope                            was passed under direct vision. Throughout the                            procedure, the patient's blood pressure, pulse, and                            oxygen saturations were monitored continuously. The                            Olympus CF-HQ190L ([id number]) Colonoscope was                             introduced through the anus and advanced to the the                            cecum, identified by appendiceal orifice and                            ileocecal valve. The ileocecal valve, appendiceal                            orifice, and rectum were photographed. The quality                            of the bowel preparation was good. The colonoscopy                            was performed without difficulty. The patient                            tolerated the procedure well. Scope In: 8:56:47 AM Scope Out: 9:10:50 AM Scope Withdrawal Time: 0 hours 12 minutes 10 seconds  Total Procedure Duration: 0 hours 14 minutes 3 seconds  Findings:                 The perianal and digital rectal examinations were                            normal.                           A 3 mm polyp was found in the proximal transverse                            colon. The polyp was sessile. The polyp was removed                            with a cold biopsy forceps. Resection and retrieval  were complete.                           Two sessile polyps were found in the sigmoid colon                            and mid transverse colon. The polyps were 6 mm in                            size. These polyps were removed with a cold snare.                            Resection and retrieval were complete.                           Scattered small-mouthed diverticula were found in                            the transverse colon. There was no evidence of                            diverticular bleeding.                           Multiple small-mouthed diverticula were found in                            the left colon. There was narrowing of the colon in                            association with the diverticular opening. There                            was no evidence of diverticular bleeding.                           The exam was otherwise without abnormality on                             direct and retroflexion views. Complications:            No immediate complications. Estimated blood loss:                            None. Estimated Blood Loss:     Estimated blood loss: none. Impression:               - One 3 mm polyp in the proximal transverse colon,                            removed with a cold biopsy forceps. Resected and                            retrieved.                           -  Two 6 mm polyps in the sigmoid colon and in the                            mid transverse colon, removed with a cold snare.                            Resected and retrieved.                           - Mild diverticulosis in the transverse colon.                           - Moderate diverticulosis in the left colon.                           - The examination was otherwise normal on direct                            and retroflexion views. Recommendation:           - Repeat colonoscopy after studies are complete for                            surveillance based on pathology results.                           - Patient has a contact number available for                            emergencies. The signs and symptoms of potential                            delayed complications were discussed with the                            patient. Return to normal activities tomorrow.                            Written discharge instructions were provided to the                            patient.                           - High fiber diet.                           - Continue present medications.                           - Await pathology results. Ladene Artist, MD 07/28/2021 9:14:58 AM This report has been signed electronically.

## 2021-07-28 NOTE — Patient Instructions (Signed)
Information on polyps and diverticulosis given to you today.  Await pathology results.  Resume previous diet and medications.   YOU HAD AN ENDOSCOPIC PROCEDURE TODAY AT [facility]:   Refer to the procedure report that was given to you for any specific questions about what was found during the examination.  If the procedure report does not answer your questions, please call your gastroenterologist to clarify.  If you requested that your care partner not be given the details of your procedure findings, then the procedure report has been included in a sealed envelope for you to review at your convenience later.  YOU SHOULD EXPECT: Some feelings of bloating in the abdomen. Passage of more gas than usual.  Walking can help get rid of the air that was put into your GI tract during the procedure and reduce the bloating. If you had a lower endoscopy (such as a colonoscopy or flexible sigmoidoscopy) you may notice spotting of blood in your stool or on the toilet paper. If you underwent a bowel prep for your procedure, you may not have a normal bowel movement for a few days.  Please Note:  You might notice some irritation and congestion in your nose or some drainage.  This is from the oxygen used during your procedure.  There is no need for concern and it should clear up in a day or so.  SYMPTOMS TO REPORT IMMEDIATELY:  Following lower endoscopy (colonoscopy or flexible sigmoidoscopy):  Excessive amounts of blood in the stool  Significant tenderness or worsening of abdominal pains  Swelling of the abdomen that is new, acute  Fever of 100F or higher  For urgent or emergent issues, a gastroenterologist can be reached at any hour by calling [phone]. Do not use MyChart messaging for urgent concerns.    DIET:  We do recommend a small meal at first, but then you may proceed to your regular diet.  Drink plenty of fluids but you should avoid alcoholic beverages for 24  hours.  ACTIVITY:  You should plan to take it easy for the rest of today and you should NOT DRIVE or use heavy machinery until tomorrow (because of the sedation medicines used during the test).    FOLLOW UP: Our staff will call the number listed on your records 24-72 hours following your procedure to check on you and address any questions or concerns that you may have regarding the information given to you following your procedure. If we do not reach you, we will leave a message.  We will attempt to reach you two times.  During this call, we will ask if you have developed any symptoms of COVID 19. If you develop any symptoms (ie: fever, flu-like symptoms, shortness of breath, cough etc.) before then, please call [phone].  If you test positive for Covid 19 in the 2 weeks post procedure, please call and report this information to [place].    If any biopsies were taken you will be contacted by phone or by letter within the next 1-3 weeks.  Please call us at [phone] if you have not heard about the biopsies in 3 weeks.    SIGNATURES/CONFIDENTIALITY: You and/or your care partner have signed paperwork which will be entered into your electronic medical record.  These signatures attest to the fact that that the information above on your After Visit Summary has been reviewed and is understood.  Full responsibility of the confidentiality of this discharge information lies with you and/or your  care-partner.  

## 2021-07-28 NOTE — Progress Notes (Signed)
Pt's states no medical or surgical changes since previsit or office visit. 

## 2021-07-28 NOTE — Progress Notes (Signed)
History & Physical  Primary Care Physician:  Prince Solian, MD Primary Gastroenterologist: Lucio Edward, MD  CHIEF COMPLAINT:   Family history of colon polyps   HPI: Jaime Rogers is a 56 y.o. female with a family history of colon polyps in first-degree relative for colonoscopy.   Past Medical History:  Diagnosis Date   Abnormal Pap smear of cervix    early 90's & 12-16-16 ASCUS HPV HR +, 01-06-18 neg HPV HR+   AC (acromioclavicular) joint bone spurs    neck   Allergy    Arthritis    in the neck   Asthma    IN THE PAST WITH ALLERGIES- NO MDI, NO ISSUES   Depression    Osteopenia    Plantar fasciitis    Shingles    in early 77s   STD (sexually transmitted disease)    chlamydia in college    Past Surgical History:  Procedure Laterality Date   APPENDECTOMY  1979   COLONOSCOPY     COLPOSCOPY     early 90's, 2018 [place]  10/04, 10/09   old mirena out/ insertion of new Mirena IUD, insertion again 12/13/12   TRIGGER FINGER RELEASE  2021   WISDOM TOOTH EXTRACTION      Prior to Admission medications   Medication Sig Start Date End Date Taking? Authorizing Provider  Ascorbic Acid (VITAMIN C PO) Take 1,000 mg by mouth.   Yes [provider]  Calcium Carbonate Antacid (TUMS PO) Take by mouth.   Yes [provider]  Cholecalciferol (D3-1000) 25 MCG (1000 UT) capsule  02/15/18  Yes [provider]  fexofenadine (ALLEGRA) 180 MG tablet Take 1 tablet by mouth daily. 11/03/10  Yes [provider]  fluticasone (FLONASE) 50 MCG/ACT nasal spray Place into both nostrils daily.   Yes [provider]  montelukast (SINGULAIR) 10 MG tablet Take 1 tablet by mouth daily.   Yes [provider]  Naproxen Sodium (ALEVE PO) Take by mouth as needed.   Yes [provider]  ALPRAZolam Duanne Moron) 0.25 MG tablet 1 tablet 10/24/19   [provider]  BIOTIN PO Take by mouth. Patient not taking:  Reported on 07/07/2021    [provider]  diazepam (VALIUM) 10 MG tablet Take 10 mg by mouth daily as needed. 03/19/21   [provider]  Multiple Vitamins-Minerals (MULTIVITAMIN PO) Take by mouth daily.    [provider]    Current Outpatient Medications  Medication Sig Dispense Refill   Ascorbic Acid (VITAMIN C PO) Take 1,000 mg by mouth.     Calcium Carbonate Antacid (TUMS PO) Take by mouth.     Cholecalciferol (D3-1000) 25 MCG (1000 UT) capsule      fexofenadine (ALLEGRA) 180 MG tablet Take 1 tablet by mouth daily.     fluticasone (FLONASE) 50 MCG/ACT nasal spray Place into both nostrils daily.     montelukast (SINGULAIR) 10 MG tablet Take 1 tablet by mouth daily.     Naproxen Sodium (ALEVE PO) Take by mouth as needed.     ALPRAZolam (XANAX) 0.25 MG tablet 1 tablet     BIOTIN PO Take by mouth. (Patient not taking: Reported on 07/07/2021)     diazepam (VALIUM) 10 MG tablet Take 10 mg by mouth daily as needed.     Multiple Vitamins-Minerals (MULTIVITAMIN PO) Take by mouth daily.     Current Facility-Administered Medications  Medication Dose Route Frequency Provider Last Rate Last Admin  0.9 %  sodium chloride infusion  500 mL Intravenous Once Ladene Artist, MD        Allergies as of 07/28/2021 - Review Complete 07/28/2021  Allergen Reaction Noted   Metronidazole Other (See Comments) 03/10/2020   Minocycline Hives and Other (See Comments) 11/29/2007    Family History  Problem Relation Age of Onset   Endometriosis Mother    Colon polyps Father    Hypertension Father    Diverticulitis Father    Leukemia Maternal Grandmother    Colon cancer Paternal Grandfather    Cancer Paternal Grandfather        colon cancer   Breast cancer Neg Hx    Esophageal cancer Neg Hx    Rectal cancer Neg Hx    Stomach cancer Neg Hx     Social History   Socioeconomic History   Marital status: Divorced    Spouse name: Not on file   Number of children: Not on file    Years of education: Not on file   Highest education level: Not on file  Occupational History   Not on file  Tobacco Use   Smoking status: Former    Types: Cigarettes    Quit date: 08/28/2005    Years since quitting: 15.9   Smokeless tobacco: Never  Vaping Use   Vaping Use: Never used  Substance and Sexual Activity   Alcohol use: Yes    Alcohol/week: 2.0 standard drinks of alcohol    Types: 2 Standard drinks or equivalent per week   Drug use: No   Sexual activity: Yes    Partners: Male    Birth control/protection: Post-menopausal  Other Topics Concern   Not on file  Social History Narrative   Not on file   Social Determinants of Health   Financial Resource Strain: Not on file  Food Insecurity: Not on file  Transportation Needs: Not on file  Physical Activity: Not on file  Stress: Not on file  Social Connections: Not on file  Intimate Partner Violence: Not on file    Review of Systems:  All systems reviewed an negative except where noted in HPI.  Gen: Denies any fever, chills, sweats, anorexia, fatigue, weakness, malaise, weight loss, and sleep disorder CV: Denies chest pain, angina, palpitations, syncope, orthopnea, PND, peripheral edema, and claudication. Resp: Denies dyspnea at rest, dyspnea with exercise, cough, sputum, wheezing, coughing up blood, and pleurisy. GI: Denies vomiting blood, jaundice, and fecal incontinence.   Denies dysphagia or odynophagia. GU : Denies urinary burning, blood in urine, urinary frequency, urinary hesitancy, nocturnal urination, and urinary incontinence. MS: Denies joint pain, limitation of movement, and swelling, stiffness, low back pain, extremity pain. Denies muscle weakness, cramps, atrophy.  Derm: Denies rash, itching, dry skin, hives, moles, warts, or unhealing ulcers.  Psych: Denies depression, anxiety, memory loss, suicidal ideation, hallucinations, paranoia, and confusion. Heme: Denies bruising, bleeding, and enlarged lymph  nodes. Neuro:  Denies any headaches, dizziness, paresthesias. Endo:  Denies any problems with DM, thyroid, adrenal function.   Physical Exam: General:  Alert, well-developed, in NAD Head:  Normocephalic and atraumatic. Eyes:  Sclera clear, no icterus.   Conjunctiva pink. Ears:  Normal auditory acuity. Mouth:  No deformity or lesions.  Neck:  Supple; no masses . Lungs:  Clear throughout to auscultation.   No wheezes, crackles, or rhonchi. No acute distress. Heart:  Regular rate and rhythm; no murmurs. Abdomen:  Soft, nondistended, nontender. No masses, hepatomegaly. No obvious masses.  Normal bowel .  Rectal:  Deferred   Msk:  Symmetrical without gross deformities.. Pulses:  Normal pulses noted. Extremities:  Without edema. Neurologic:  Alert and  oriented x4;  grossly normal neurologically. Skin:  Intact without significant lesions or rashes. Cervical Nodes:  No significant cervical adenopathy. Psych:  Alert and cooperative. Normal mood and affect.  Impression / Plan:   Family history of colon polyps in first-degree relative for colonoscopy.  Pricilla Riffle. Fuller Plan  07/28/2021, 8:47 AM See Shea Evans, [facility], to contact our on call provider

## 2021-07-28 NOTE — Progress Notes (Signed)
A and O x3. Report to RN. Tolerated MAC anesthesia well. 

## 2021-07-28 NOTE — Progress Notes (Signed)
Called to room to assist during endoscopic procedure.  Patient ID and intended procedure confirmed with present staff. Received instructions for my participation in the procedure from the performing physician.  

## 2021-07-29 ENCOUNTER — Telehealth: Payer: Self-pay

## 2021-07-29 NOTE — Telephone Encounter (Signed)
  Follow up Call-     07/28/2021    8:39 AM  Call back number  Post procedure Call Back phone  # [phone]  Permission to leave phone message Yes     Patient questions:  Do you have a fever, pain , or abdominal swelling? No. Pain Score  0 *  Have you tolerated food without any problems? Yes.    Have you been able to return to your normal activities? Yes.    Do you have any questions about your discharge instructions: Diet   No. Medications  No. Follow up visit  No.  Do you have questions or concerns about your Care? No.  Actions: * If pain score is 4 or above: No action needed, pain <4.

## 2021-08-07 ENCOUNTER — Encounter: Payer: Self-pay | Admitting: Gastroenterology

## 2021-09-22 DIAGNOSIS — R058 Other specified cough: Secondary | ICD-10-CM | POA: Diagnosis not present

## 2021-10-05 ENCOUNTER — Other Ambulatory Visit: Payer: Self-pay | Admitting: Urology

## 2021-10-05 DIAGNOSIS — D3001 Benign neoplasm of right kidney: Secondary | ICD-10-CM

## 2021-11-03 ENCOUNTER — Ambulatory Visit
Admission: RE | Admit: 2021-11-03 | Discharge: 2021-11-03 | Disposition: A | Discharge status: Home / Self Care | Payer: BC Managed Care – PPO | Source: Ambulatory Visit | Attending: Urology | Admitting: Urology

## 2021-11-03 DIAGNOSIS — D1771 Benign lipomatous neoplasm of kidney: Secondary | ICD-10-CM | POA: Diagnosis not present

## 2021-11-03 DIAGNOSIS — N2889 Other specified disorders of kidney and ureter: Secondary | ICD-10-CM | POA: Diagnosis not present

## 2021-11-03 DIAGNOSIS — D3001 Benign neoplasm of right kidney: Secondary | ICD-10-CM

## 2021-11-03 MED ORDER — GADOBENATE DIMEGLUMINE 529 MG/ML IV SOLN
15.0000 mL | Freq: Once | INTRAVENOUS | Status: AC | PRN
  Administered 2021-11-03: 15 mL via INTRAVENOUS

## 2021-11-11 DIAGNOSIS — D3001 Benign neoplasm of right kidney: Secondary | ICD-10-CM | POA: Diagnosis not present

## 2022-01-15 DIAGNOSIS — R058 Other specified cough: Secondary | ICD-10-CM | POA: Diagnosis not present

## 2022-05-27 DIAGNOSIS — L814 Other melanin hyperpigmentation: Secondary | ICD-10-CM | POA: Diagnosis not present

## 2022-05-27 DIAGNOSIS — D225 Melanocytic nevi of trunk: Secondary | ICD-10-CM | POA: Diagnosis not present

## 2022-05-27 DIAGNOSIS — L578 Other skin changes due to chronic exposure to nonionizing radiation: Secondary | ICD-10-CM | POA: Diagnosis not present

## 2022-05-27 DIAGNOSIS — L821 Other seborrheic keratosis: Secondary | ICD-10-CM | POA: Diagnosis not present

## 2022-06-02 DIAGNOSIS — M859 Disorder of bone density and structure, unspecified: Secondary | ICD-10-CM | POA: Diagnosis not present

## 2022-06-02 DIAGNOSIS — F419 Anxiety disorder, unspecified: Secondary | ICD-10-CM | POA: Diagnosis not present

## 2022-06-02 DIAGNOSIS — E785 Hyperlipidemia, unspecified: Secondary | ICD-10-CM | POA: Diagnosis not present

## 2022-06-08 DIAGNOSIS — Z0000 Encounter for general adult medical examination without abnormal findings: Secondary | ICD-10-CM | POA: Diagnosis not present

## 2022-06-08 DIAGNOSIS — F419 Anxiety disorder, unspecified: Secondary | ICD-10-CM | POA: Diagnosis not present

## 2022-06-08 DIAGNOSIS — Z1331 Encounter for screening for depression: Secondary | ICD-10-CM | POA: Diagnosis not present

## 2022-06-08 DIAGNOSIS — R82998 Other abnormal findings in urine: Secondary | ICD-10-CM | POA: Diagnosis not present

## 2022-06-08 DIAGNOSIS — Z1339 Encounter for screening examination for other mental health and behavioral disorders: Secondary | ICD-10-CM | POA: Diagnosis not present

## 2022-06-09 ENCOUNTER — Other Ambulatory Visit: Payer: Self-pay | Admitting: Internal Medicine

## 2022-06-09 DIAGNOSIS — E785 Hyperlipidemia, unspecified: Secondary | ICD-10-CM

## 2022-07-21 ENCOUNTER — Ambulatory Visit
Admission: RE | Admit: 2022-07-21 | Discharge: 2022-07-21 | Disposition: A | Discharge status: Home / Self Care | Payer: No Typology Code available for payment source | Source: Ambulatory Visit | Attending: Internal Medicine | Admitting: Internal Medicine

## 2022-07-21 DIAGNOSIS — E785 Hyperlipidemia, unspecified: Secondary | ICD-10-CM

## 2022-09-21 DIAGNOSIS — M8589 Other specified disorders of bone density and structure, multiple sites: Secondary | ICD-10-CM | POA: Diagnosis not present

## 2022-10-12 DIAGNOSIS — L820 Inflamed seborrheic keratosis: Secondary | ICD-10-CM | POA: Diagnosis not present

## 2022-10-14 DIAGNOSIS — E785 Hyperlipidemia, unspecified: Secondary | ICD-10-CM | POA: Diagnosis not present

## 2022-10-14 DIAGNOSIS — S0501XA Injury of conjunctiva and corneal abrasion without foreign body, right eye, initial encounter: Secondary | ICD-10-CM | POA: Diagnosis not present

## 2022-10-14 DIAGNOSIS — H5711 Ocular pain, right eye: Secondary | ICD-10-CM | POA: Diagnosis not present

## 2022-10-19 DIAGNOSIS — S0502XD Injury of conjunctiva and corneal abrasion without foreign body, left eye, subsequent encounter: Secondary | ICD-10-CM | POA: Diagnosis not present

## 2022-12-06 DIAGNOSIS — Z23 Encounter for immunization: Secondary | ICD-10-CM | POA: Diagnosis not present

## 2023-01-07 DIAGNOSIS — R5383 Other fatigue: Secondary | ICD-10-CM | POA: Diagnosis not present

## 2023-01-07 DIAGNOSIS — R051 Acute cough: Secondary | ICD-10-CM | POA: Diagnosis not present

## 2023-01-07 DIAGNOSIS — R0981 Nasal congestion: Secondary | ICD-10-CM | POA: Diagnosis not present

## 2023-04-05 DIAGNOSIS — L570 Actinic keratosis: Secondary | ICD-10-CM | POA: Diagnosis not present

## 2023-05-27 DIAGNOSIS — L578 Other skin changes due to chronic exposure to nonionizing radiation: Secondary | ICD-10-CM | POA: Diagnosis not present

## 2023-05-27 DIAGNOSIS — D225 Melanocytic nevi of trunk: Secondary | ICD-10-CM | POA: Diagnosis not present

## 2023-05-27 DIAGNOSIS — L821 Other seborrheic keratosis: Secondary | ICD-10-CM | POA: Diagnosis not present

## 2023-05-27 DIAGNOSIS — L814 Other melanin hyperpigmentation: Secondary | ICD-10-CM | POA: Diagnosis not present

## 2023-05-27 DIAGNOSIS — L820 Inflamed seborrheic keratosis: Secondary | ICD-10-CM | POA: Diagnosis not present

## 2023-06-17 DIAGNOSIS — M8580 Other specified disorders of bone density and structure, unspecified site: Secondary | ICD-10-CM | POA: Diagnosis not present

## 2023-06-17 DIAGNOSIS — E785 Hyperlipidemia, unspecified: Secondary | ICD-10-CM | POA: Diagnosis not present

## 2023-06-24 DIAGNOSIS — Z0000 Encounter for general adult medical examination without abnormal findings: Secondary | ICD-10-CM | POA: Diagnosis not present

## 2023-06-24 DIAGNOSIS — M8580 Other specified disorders of bone density and structure, unspecified site: Secondary | ICD-10-CM | POA: Diagnosis not present

## 2023-06-24 DIAGNOSIS — Z1339 Encounter for screening examination for other mental health and behavioral disorders: Secondary | ICD-10-CM | POA: Diagnosis not present

## 2023-06-24 DIAGNOSIS — Z1331 Encounter for screening for depression: Secondary | ICD-10-CM | POA: Diagnosis not present

## 2023-06-24 DIAGNOSIS — R82998 Other abnormal findings in urine: Secondary | ICD-10-CM | POA: Diagnosis not present

## 2023-08-23 ENCOUNTER — Other Ambulatory Visit: Payer: Self-pay | Admitting: Urology

## 2023-08-23 DIAGNOSIS — D3001 Benign neoplasm of right kidney: Secondary | ICD-10-CM

## 2023-08-24 ENCOUNTER — Encounter: Payer: Self-pay | Admitting: Urology

## 2023-10-20 ENCOUNTER — Encounter: Payer: Self-pay | Admitting: Urology

## 2023-10-25 ENCOUNTER — Ambulatory Visit
Admission: RE | Admit: 2023-10-25 | Discharge: 2023-10-25 | Disposition: A | Discharge status: Home / Self Care | Source: Ambulatory Visit | Attending: Urology

## 2023-10-25 DIAGNOSIS — C641 Malignant neoplasm of right kidney, except renal pelvis: Secondary | ICD-10-CM | POA: Diagnosis not present

## 2023-10-25 DIAGNOSIS — D1771 Benign lipomatous neoplasm of kidney: Secondary | ICD-10-CM | POA: Diagnosis not present

## 2023-10-25 DIAGNOSIS — D3001 Benign neoplasm of right kidney: Secondary | ICD-10-CM

## 2023-10-25 MED ORDER — GADOPICLENOL 0.5 MMOL/ML IV SOLN
7.0000 mL | Freq: Once | INTRAVENOUS | Status: AC | PRN
  Administered 2023-10-25: 7 mL via INTRAVENOUS

## 2023-11-01 DIAGNOSIS — D3001 Benign neoplasm of right kidney: Secondary | ICD-10-CM | POA: Diagnosis not present

## 2023-12-22 DIAGNOSIS — Z23 Encounter for immunization: Secondary | ICD-10-CM | POA: Diagnosis not present

## 2024-01-06 LAB — LAB REPORT - SCANNED: TSH: 2.02 (ref 0.41–5.90)

## 2024-01-22 DIAGNOSIS — J00 Acute nasopharyngitis [common cold]: Secondary | ICD-10-CM | POA: Diagnosis not present

## 2024-02-01 ENCOUNTER — Other Ambulatory Visit (HOSPITAL_COMMUNITY)
Admission: RE | Admit: 2024-02-01 | Discharge: 2024-02-01 | Disposition: A | Discharge status: Home / Self Care | Source: Ambulatory Visit | Attending: Obstetrics and Gynecology | Admitting: Obstetrics and Gynecology

## 2024-02-01 ENCOUNTER — Encounter: Payer: Self-pay | Admitting: Obstetrics and Gynecology

## 2024-02-01 ENCOUNTER — Ambulatory Visit: Admitting: Obstetrics and Gynecology

## 2024-02-01 VITALS — BP 110/76 | HR 69 | Ht 64.25 in | Wt 167.0 lb

## 2024-02-01 DIAGNOSIS — Z87410 Personal history of cervical dysplasia: Secondary | ICD-10-CM

## 2024-02-01 DIAGNOSIS — Z8619 Personal history of other infectious and parasitic diseases: Secondary | ICD-10-CM | POA: Insufficient documentation

## 2024-02-01 DIAGNOSIS — Z01419 Encounter for gynecological examination (general) (routine) without abnormal findings: Secondary | ICD-10-CM | POA: Diagnosis not present

## 2024-02-01 DIAGNOSIS — N952 Postmenopausal atrophic vaginitis: Secondary | ICD-10-CM | POA: Diagnosis not present

## 2024-02-01 DIAGNOSIS — Z124 Encounter for screening for malignant neoplasm of cervix: Secondary | ICD-10-CM | POA: Insufficient documentation

## 2024-02-01 DIAGNOSIS — Z1331 Encounter for screening for depression: Secondary | ICD-10-CM

## 2024-02-01 NOTE — Progress Notes (Unsigned)
 58 y.o. G12P0010 Divorced Caucasian female here for annual exam.    Had blood work done at a friend's hormone clinic.  Brings in labs.   Considering vaginal estrogen cream.    Has seen Dr. Cam.  Has an angiolipoma.   Did pelvic floor therapy.   Works at [employer].   PCP: Avva, Ravisankar, MD   Patient's last menstrual period was 11/12/2013.           Sexually active: Yes.    The current method of family planning is post menopausal status.    Menopausal hormone therapy:  n/a Exercising: Yes.    Starting with trainer tomorrow at gym Smoker:  Former   OB History  Gravida Para Term Preterm AB Living  1    1 0  SAB IAB Ectopic Multiple Live Births   1       # Outcome Date GA Lbr Len/2nd Weight Sex Type Anes PTL Lv  1 IAB              HEALTH MAINTENANCE: Last 2 paps:  04/21/21 LSIL, HR HPV +, 03/19/20 ASCUS, HR HPV neg,  01-16-19 Neg:Neg HR HPV, 01-06-18 Neg:Pos HR HPV  History of abnormal Pap or positive HPV:  yes, 01-26-18 colpo revealed cervical biopsies with inflammation and benign squamous epithelium;ECC benign squamous epithelium. 01-06-18 Neg:Pos HR HPV. 01-11-17 colpo revealed LSIL, 2nd bx showed koilocytic atypia c/w HR HPV effect;ECC benign quamous endocervical epithelium. 12-16-16 ASCUS:Pos HR HPV.    Mammogram:   02/03/21 Breast Density Cat C, BIRADS Cat 1 neg  Colonoscopy:  07/28/21 - polyps - due in 2028 Bone Density:  2020 - osteopenia - PCP office.   Immunization History  Administered Date(s) Administered   Influenza,inj,Quad PF,6+ Mos 12/20/2017   Influenza-Unspecified 10/06/2018   Tdap 02/15/2009      reports that she quit smoking about 18 years ago. Her smoking use included cigarettes. She has never used smokeless tobacco. She reports current alcohol  use of about 2.0 standard drinks of alcohol  per week. She reports that she does not use drugs.  Past Medical History:  Diagnosis Date   Abnormal Pap smear of cervix    early 90's & 12-16-16 ASCUS  HPV HR +, 01-06-18 neg HPV HR+   AC (acromioclavicular) joint bone spurs    neck   Allergy    Angiolipoma of right kidney    Arthritis    in the neck   Asthma    IN THE PAST WITH ALLERGIES- NO MDI, NO ISSUES   Depression    Osteopenia    Plantar fasciitis    Shingles    in early 43s   STD (sexually transmitted disease)    chlamydia in college    Past Surgical History:  Procedure Laterality Date   APPENDECTOMY  1979   COLONOSCOPY     COLPOSCOPY     early 90's, 2018 CIN1,2023   INTRAUTERINE DEVICE INSERTION  10/04, 10/09   old mirena out/ insertion of new Mirena IUD, insertion again 12/13/12   TRIGGER FINGER RELEASE  2021   WISDOM TOOTH EXTRACTION      Current Outpatient Medications  Medication Sig Dispense Refill   ALPRAZolam (XANAX) 0.25 MG tablet 1 tablet     Ascorbic Acid (VITAMIN C PO) Take 1,000 mg by mouth.     Calcium Carbonate Antacid (TUMS PO) Take by mouth.     Cholecalciferol (D3-1000) 25 MCG (1000 UT) capsule      fexofenadine (ALLEGRA) 180 MG  tablet Take 1 tablet by mouth daily.     fluticasone (FLONASE) 50 MCG/ACT nasal spray Place into both nostrils daily.     montelukast (SINGULAIR) 10 MG tablet Take 1 tablet by mouth daily.     Multiple Vitamins-Minerals (MULTIVITAMIN PO) Take by mouth daily.     Naproxen Sodium (ALEVE PO) Take by mouth as needed.     tirzepatide (ZEPBOUND) 7.5 MG/0.5ML Pen 2.5 mg.     BIOTIN PO Take by mouth. (Patient not taking: Reported on 02/01/2024)     diazepam (VALIUM) 10 MG tablet Take 10 mg by mouth daily as needed. (Patient not taking: Reported on 02/01/2024)     No current facility-administered medications for this visit.    ALLERGIES: Metronidazole  and Minocycline  Family History  Problem Relation Age of Onset   Endometriosis Mother    Colon polyps Father    Hypertension Father    Diverticulitis Father    Leukemia Maternal Grandmother    Colon cancer Paternal Grandfather    Cancer Paternal Grandfather        colon  cancer   Breast cancer Neg Hx    Esophageal cancer Neg Hx    Rectal cancer Neg Hx    Stomach cancer Neg Hx     Review of Systems  PHYSICAL EXAM:  BP 110/76 (BP Location: Left Arm, Patient Position: Sitting, Cuff Size: Normal)   Pulse 69   Ht 5' 4.25 (1.632 m)   Wt 167 lb (75.8 kg)   LMP 11/12/2013 Comment: spotting  SpO2 99%   BMI 28.44 kg/m     General appearance: alert, cooperative and appears stated age Head: normocephalic, without obvious abnormality, atraumatic Neck: no adenopathy, supple, symmetrical, trachea midline and thyroid  normal to inspection and palpation Lungs: clear to auscultation bilaterally Breasts: normal appearance, no masses or tenderness, No nipple retraction or dimpling, No nipple discharge or bleeding, No axillary adenopathy Heart: regular rate and rhythm Abdomen: soft, non-tender; no masses, no organomegaly Extremities: extremities normal, atraumatic, no cyanosis or edema Skin: skin color, texture, turgor normal. No rashes or lesions Lymph nodes: cervical, supraclavicular, and axillary nodes normal. Neurologic: grossly normal  Pelvic: External genitalia:  small fissure of skin of perineal body.               No abnormal inguinal nodes palpated.              Urethra:  normal appearing urethra with no masses, tenderness or lesions              Bartholins and Skenes: normal                 Vagina: normal appearing vagina with normal color and discharge, no lesions              Cervix: no lesions              Pap taken: yes Bimanual Exam:  Uterus:  normal size, contour, position, consistency, mobility, non-tender              Adnexa: no mass, fullness, tenderness              Rectal exam: Declined  Chaperone was present for exam:  shannon  ASSESSMENT: Well woman visit with gynecologic exam. Hx LGSIL and positive HR HPV.  Osteopenia. PHQ-2-9: 0  ***  PLAN: Mammogram screening discussed.  Patient will update at [facility].  Self breast  awareness reviewed. Pap and HRV collected:  yes Guidelines for Calcium, Vitamin  D, regular exercise program including cardiovascular and weight bearing exercise. Medication refills:  Start vag estradiol after mammogram is back and normal.    Follow up:  yearly and prn.

## 2024-02-01 NOTE — Patient Instructions (Signed)

## 2024-02-06 ENCOUNTER — Ambulatory Visit: Payer: Self-pay | Admitting: Obstetrics and Gynecology

## 2024-02-06 DIAGNOSIS — R87610 Atypical squamous cells of undetermined significance on cytologic smear of cervix (ASC-US): Secondary | ICD-10-CM

## 2024-02-06 DIAGNOSIS — Z87410 Personal history of cervical dysplasia: Secondary | ICD-10-CM

## 2024-02-06 LAB — CYTOLOGY - PAP
Adequacy: ABSENT
Comment: NEGATIVE
Diagnosis: UNDETERMINED — AB
High risk HPV: NEGATIVE

## 2024-03-20 ENCOUNTER — Encounter: Payer: Self-pay | Admitting: Obstetrics and Gynecology

## 2024-03-20 ENCOUNTER — Ambulatory Visit (INDEPENDENT_AMBULATORY_CARE_PROVIDER_SITE_OTHER): Admitting: Obstetrics and Gynecology

## 2024-03-20 ENCOUNTER — Other Ambulatory Visit (HOSPITAL_COMMUNITY): Admission: RE | Admit: 2024-03-20 | Discharge: 2024-03-20 | Disposition: A | Source: Ambulatory Visit

## 2024-03-20 VITALS — BP 116/74 | HR 68

## 2024-03-20 DIAGNOSIS — Z87410 Personal history of cervical dysplasia: Secondary | ICD-10-CM

## 2024-03-20 DIAGNOSIS — R87610 Atypical squamous cells of undetermined significance on cytologic smear of cervix (ASC-US): Secondary | ICD-10-CM

## 2024-03-20 DIAGNOSIS — N952 Postmenopausal atrophic vaginitis: Secondary | ICD-10-CM

## 2024-03-20 NOTE — Patient Instructions (Signed)
 Colposcopy, Care After  The following information offers guidance on how to care for yourself after your procedure. Your doctor may also give you more specific instructions. If you have problems or questions, contact your doctor. What can I expect after the procedure? If you did not have a sample of your tissue taken out (did not have a biopsy), you may only have some spotting of blood for a few days. You can go back to your normal activities. If you had a sample of your tissue taken out, it is common to have: Soreness and mild pain. These may last for a few days. Mild bleeding or fluid (discharge) coming from your vagina. The fluid will look dark and grainy. You may have this for a few days. The fluid may be caused by a liquid that was used during your procedure. You may need to wear a sanitary pad. Spotting of blood for at least 48 hours after the procedure. Follow these instructions at home: Medicines Take over-the-counter and prescription medicines only as told by your doctor. Ask your doctor what over-the-counter pain medicines and prescription medicines you can start taking again. This is very important if you take blood thinners. Activity For at least 3 days, or for as long as told by your doctor, avoid: Douching. Using tampons. Having sex. Return to your normal activities as told by your doctor. Ask your doctor what activities are safe for you. General instructions Ask your doctor if you may take baths, swim, or use a hot tub. You may take showers. If you use birth control (contraception), keep using it. Keep all follow-up visits. Contact a doctor if: You have a fever or chills. You faint or feel light-headed. Get help right away if: You bleed a lot from your vagina. A lot of bleeding means that the bleeding soaks through a pad in less than 1 hour. You have clumps of blood (blood clots) coming from your vagina. You have signs that could mean you have an infection. This may be  fluid coming from your vagina that is: Different than normal. Yellow. Bad-smelling. You have very bad pain or cramps in your lower belly that do not get better with medicine. Summary If you did not have a sample of your tissue taken out, you may only have some spotting of blood for a few days. You can go back to your normal activities. If you had a sample of your tissue taken out, it is common to have mild pain for a few days and spotting for 48 hours. Avoid douching, using tampons, and having sex for at least 3 days after the procedure or for as long as told. Get help right away if you have a lot of bleeding, very bad pain, or signs of infection. This information is not intended to replace advice given to you by your health care provider. Make sure you discuss any questions you have with your health care provider. Document Revised: 06/29/2020 Document Reviewed: 06/29/2020 [employer] Patient Education  2024 [employer].

## 2025-02-04 ENCOUNTER — Ambulatory Visit: Admitting: Obstetrics and Gynecology
